# Patient Record
Sex: Male | Born: 1950 | Race: White | Hispanic: No | Marital: Single | Smoking: Current every day smoker
Health system: Southern US, Community
[De-identification: ages and names within clinical notes are randomized; demographics above are authoritative.]

## PROBLEM LIST (undated history)

## (undated) DIAGNOSIS — R319 Hematuria, unspecified: Secondary | ICD-10-CM

## (undated) DIAGNOSIS — M6530 Trigger finger, unspecified finger: Secondary | ICD-10-CM

## (undated) DIAGNOSIS — T4145XA Adverse effect of unspecified anesthetic, initial encounter: Secondary | ICD-10-CM

## (undated) DIAGNOSIS — T8859XA Other complications of anesthesia, initial encounter: Secondary | ICD-10-CM

## (undated) DIAGNOSIS — C801 Malignant (primary) neoplasm, unspecified: Secondary | ICD-10-CM

## (undated) HISTORY — DX: Malignant (primary) neoplasm, unspecified: C80.1

## (undated) HISTORY — PX: WISDOM TOOTH EXTRACTION: SHX21

---
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
## 1986-07-03 HISTORY — PX: OTHER SURGICAL HISTORY: SHX169

## 2017-07-03 DIAGNOSIS — M6530 Trigger finger, unspecified finger: Secondary | ICD-10-CM

## 2017-07-03 HISTORY — DX: Trigger finger, unspecified finger: M65.30

## 2018-02-01 NOTE — Progress Notes (Signed)
02/04/2018 3:44 PM   Jared Nolan 1950-09-10 [id number]  Referring provider: Mariana Arn, MD [address] [place], [place] [address]  Chief Complaint  Patient presents with  . Hematuria    HPI: Patient is a 67 -year-old Caucasian male who presents today as a referral from Dr. Mariana Arn for gross hematuria.    He has been having intermittent gross hematuria for two years associated with dysuria and a decreased urinary stream.    He does not have a prior history of recurrent urinary tract infections, nephrolithiasis, trauma to the genitourinary tract, BPH or malignancies of the genitourinary tract.   His paternal uncle had fatal prostate cancer and died at age 36.  His father had stones.      Today, he is having symptoms of frequent urination, urgency, nocturia, hesitancy, intermittency, straining to urinate or a weak urinary stream.  Patient denies any gross hematuria, dysuria or suprapubic/flank pain.  Patient denies any fevers, chills, nausea or vomiting.  His UA today demonstrates 6-10 WBC's and > 30 RBC's and many bacteria.  PVR is 132 mL.    He is a smoker.    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   He has HTN.      PMH: History reviewed. No pertinent past medical history.  Surgical History: History reviewed. No pertinent surgical history.  Home Medications:  Allergies as of 02/04/2018   No Known Allergies     Medication List    as of 02/04/2018  3:44 PM   You have not been prescribed any medications.     Allergies: No Known Allergies  Family History: Family History  Problem Relation Age of Onset  . Prostate cancer Paternal Uncle     Social History:  reports that he has been smoking cigarettes.  He has smoked for the past 20.00 years. He has never used smokeless tobacco. He reports that he drank alcohol. He reports that he does not use drugs.  ROS: UROLOGY Frequent Urination?: Yes Hard to postpone urination?:  Yes Burning/pain with urination?: No Get up at night to urinate?: Yes Leakage of urine?: No Urine stream starts and stops?: Yes Trouble starting stream?: Yes Do you have to strain to urinate?: Yes Blood in urine?: No Urinary tract infection?: No Sexually transmitted disease?: No Injury to kidneys or bladder?: No Painful intercourse?: No Weak stream?: Yes Erection problems?: No Penile pain?: No  Gastrointestinal Nausea?: No Vomiting?: No Indigestion/heartburn?: No Diarrhea?: No Constipation?: No  Constitutional Fever: No Night sweats?: No Weight loss?: No Fatigue?: No  Skin Skin rash/lesions?: No Itching?: No  Eyes Blurred vision?: No Double vision?: No  Ears/Nose/Throat Sore throat?: No Sinus problems?: No  Hematologic/Lymphatic Swollen glands?: No Easy bruising?: No  Cardiovascular Leg swelling?: No Chest pain?: No  Respiratory Cough?: No Shortness of breath?: No  Endocrine Excessive thirst?: No  Musculoskeletal Back pain?: No Joint pain?: No  Neurological Headaches?: No Dizziness?: No  Psychologic Depression?: No Anxiety?: No  Physical Exam: BP (!) 147/74 (BP Location: Left Arm, Patient Position: Sitting, Cuff Size: Normal)   Pulse 81   Ht 5\' 7"  (1.702 m)   Wt 167 lb 11.2 oz (76.1 kg)   BMI 26.27 kg/m   Constitutional:  Well nourished. Alert and oriented, No acute distress. HEENT: [place] AT, moist mucus membranes.  Trachea midline, no masses. Cardiovascular: No clubbing, cyanosis, or edema. Respiratory: Normal respiratory effort, no increased work of breathing. GI: Abdomen is soft, non tender, non distended, no abdominal  masses. Liver and spleen not palpable.  No hernias appreciated.  Stool sample for occult testing is not indicated.   GU: No CVA tenderness.  No bladder fullness or masses.  Patient with circumcised phallus.   Urethral meatus is patent.  No penile discharge. No penile lesions or rashes. Scrotum without lesions, cysts,  rashes and/or edema.  Testicles are located scrotally bilaterally. No masses are appreciated in the testicles. Left and right epididymis are normal. Rectal: Patient with  normal sphincter tone. Anus and perineum without scarring or rashes. No rectal masses are appreciated. Prostate is approximately 60 grams, could only palpate the apex and midportion, no nodules are appreciated. Seminal vesicles are normal. Skin: No rashes, bruises or suspicious lesions. Lymph: No cervical or inguinal adenopathy. Neurologic: Grossly intact, no focal deficits, moving all 4 extremities. Psychiatric: Normal mood and affect.  Laboratory Data: No results found for: WBC, HGB, HCT, MCV, PLT  No results found for: CREATININE  No results found for: PSA  No results found for: TESTOSTERONE  No results found for: HGBA1C  No results found for: TSH  No results found for: CHOL, HDL, CHOLHDL, VLDL, LDLCALC  No results found for: AST No results found for: ALT No components found for: ALKALINEPHOPHATASE No components found for: BILIRUBINTOTAL  No results found for: ESTRADIOL   Urinalysis See Epic and HPI  I have reviewed the labs.  Assessment & Plan:   1. Gross hematuria Explained to the patient that there are a number of causes that can be associated with blood in the urine, such as stones, BPH, UTI's, damage to the urinary tract and/or cancer. At this time, I felt that the patient warranted further urologic evaluation.   The AUA guidelines state that a CT urogram is the preferred imaging study to evaluate hematuria. I explained to the patient that a contrast material will be injected into a vein and that in rare instances, an allergic reaction can result and may even life threatening   The patient denies any allergies to contrast, iodine and/or seafood and is not taking metformin. Following the imaging study,  I've recommended a cystoscopy. I described how this is performed, typically in an office setting  with a flexible cystoscope. We described the risks, benefits, and possible side effects, the most common of which is a minor amount of blood in the urine and/or burning which usually resolves in 24 to 48 hours.   The patient had the opportunity to ask questions which were answered. Based upon this discussion, the patient is willing to proceed. Therefore, I've ordered: a CT Urogram and cystoscopy.  The patient will return following all of the above for discussion of the results.  UA - see HPI and Epic  Urine culture pending  BUN + creatinine pending   Return for CT Urogram report and cystoscopy.  These notes generated with voice recognition software. I apologize for typographical errors.  Zara Council, PA-C  [facility] [address] [place]  [place], [place] [address] [phone]

## 2018-02-04 ENCOUNTER — Encounter: Payer: Self-pay | Admitting: Urology

## 2018-02-04 ENCOUNTER — Ambulatory Visit (INDEPENDENT_AMBULATORY_CARE_PROVIDER_SITE_OTHER): Payer: Medicare Other | Admitting: Urology

## 2018-02-04 VITALS — BP 147/74 | HR 81 | Ht 67.0 in | Wt 167.7 lb

## 2018-02-04 DIAGNOSIS — R310 Gross hematuria: Secondary | ICD-10-CM | POA: Diagnosis not present

## 2018-02-04 DIAGNOSIS — Z720 Tobacco use: Secondary | ICD-10-CM | POA: Insufficient documentation

## 2018-02-04 LAB — URINALYSIS, COMPLETE
Bilirubin, UA: NEGATIVE
Glucose, UA: NEGATIVE
KETONES UA: NEGATIVE
Nitrite, UA: NEGATIVE
SPEC GRAV UA: 1.02 (ref 1.005–1.030)
Urobilinogen, Ur: 0.2 mg/dL (ref 0.2–1.0)
pH, UA: 6 (ref 5.0–7.5)

## 2018-02-04 LAB — MICROSCOPIC EXAMINATION

## 2018-02-04 LAB — BLADDER SCAN AMB NON-IMAGING

## 2018-02-08 LAB — CULTURE, URINE COMPREHENSIVE

## 2018-03-08 ENCOUNTER — Ambulatory Visit
Admission: RE | Admit: 2018-03-08 | Discharge: 2018-03-08 | Disposition: A | Discharge status: Home / Self Care | Payer: Medicare Other | Source: Ambulatory Visit | Attending: Urology | Admitting: Urology

## 2018-03-08 ENCOUNTER — Telehealth: Payer: Self-pay

## 2018-03-08 DIAGNOSIS — M48061 Spinal stenosis, lumbar region without neurogenic claudication: Secondary | ICD-10-CM | POA: Insufficient documentation

## 2018-03-08 DIAGNOSIS — R310 Gross hematuria: Secondary | ICD-10-CM | POA: Insufficient documentation

## 2018-03-08 DIAGNOSIS — N329 Bladder disorder, unspecified: Secondary | ICD-10-CM | POA: Diagnosis not present

## 2018-03-08 LAB — POCT I-STAT CREATININE: CREATININE: 0.9 mg/dL (ref 0.61–1.24)

## 2018-03-08 MED ORDER — IOPAMIDOL (ISOVUE-300) INJECTION 61%
125.0000 mL | Freq: Once | INTRAVENOUS | Status: AC | PRN
  Administered 2018-03-08: 125 mL via INTRAVENOUS

## 2018-03-08 NOTE — Telephone Encounter (Signed)
Incoming call from [facility] radiology alerting you to the imaging report on this patient please review

## 2018-03-13 ENCOUNTER — Encounter: Payer: Self-pay | Admitting: Urology

## 2018-03-13 ENCOUNTER — Ambulatory Visit (INDEPENDENT_AMBULATORY_CARE_PROVIDER_SITE_OTHER): Payer: Medicare Other | Admitting: Urology

## 2018-03-13 VITALS — BP 120/67 | HR 83 | Ht 67.0 in | Wt 168.5 lb

## 2018-03-13 DIAGNOSIS — R310 Gross hematuria: Secondary | ICD-10-CM | POA: Diagnosis not present

## 2018-03-13 LAB — URINALYSIS, COMPLETE
BILIRUBIN UA: NEGATIVE
GLUCOSE, UA: NEGATIVE
Nitrite, UA: NEGATIVE
PH UA: 6.5 (ref 5.0–7.5)
SPEC GRAV UA: 1.02 (ref 1.005–1.030)
Urobilinogen, Ur: 1 mg/dL (ref 0.2–1.0)

## 2018-03-13 LAB — MICROSCOPIC EXAMINATION
Epithelial Cells (non renal): NONE SEEN /hpf (ref 0–10)
RBC, UA: >30 /hpf — ABNORMAL HIGH (ref 0–2)

## 2018-03-13 MED ORDER — LIDOCAINE HCL URETHRAL/MUCOSAL 2 % EX GEL: 1.0000 "application " | Freq: Once | CUTANEOUS | Status: DC

## 2018-03-13 NOTE — Progress Notes (Signed)
   03/13/18  CC:  Chief Complaint  Patient presents with  . Cysto   Indications: Gross hematuria  HPI: 67 year old male seen by Zara Council on 02/04/2018 for intermittent gross hematuria x2 years.  CT urogram performed on 03/08/2018 showed a greater than 6 cm intravesical mass encompassing most of the right side of the bladder.  No hydronephrosis was present.  Blood pressure 120/67, pulse 83, height 5\' 7"  (1.702 m), weight 168 lb 8 oz (76.4 kg). NED. A&Ox3.   No respiratory distress   Abd soft, NT, ND Normal phallus with bilateral descended testicles  Cystoscopy Procedure Note  Patient identification was confirmed, informed consent was obtained, and patient was prepped using Betadine solution.  Lidocaine jelly was administered per urethral meatus.    Preoperative abx where received prior to procedure.     Pre-Procedure: - Inspection reveals a normal caliber ureteral meatus.  Procedure: The flexible cystoscope was introduced without difficulty - No urethral strictures/lesions are present. - Nonocclusive prostate - Normal bladder neck - Bilateral ureteral orifices not identified - Bladder mucosa  reveals a large mixed papillary/solid mass encompassing the right side of the bladder.  The ureteral views were not visualized due to suboptimal visualization.  Tumor was encroaching the bladder neck but did not involve the prostatic urethra grossly.   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  The findings were discussed in detail with Mr. Fabio Neighbors and I recommended scheduling transurethral section of bladder tumor.  The potential need for a right ureteral stent was also discussed.  Potential risks were discussed including bleeding, infection/sepsis and bladder perforation.  The need for a Foley catheter postoperatively was  also discussed.  He indicated all questions were answered and desires to schedule.

## 2018-03-15 ENCOUNTER — Other Ambulatory Visit: Payer: Self-pay | Admitting: Radiology

## 2018-03-15 DIAGNOSIS — D494 Neoplasm of unspecified behavior of bladder: Secondary | ICD-10-CM

## 2018-03-26 ENCOUNTER — Encounter
Admission: RE | Admit: 2018-03-26 | Discharge: 2018-03-26 | Disposition: A | Discharge status: Home / Self Care | Payer: Medicare Other | Source: Ambulatory Visit | Attending: Urology | Admitting: Urology

## 2018-03-26 ENCOUNTER — Other Ambulatory Visit: Payer: Self-pay

## 2018-03-26 DIAGNOSIS — Z01818 Encounter for other preprocedural examination: Secondary | ICD-10-CM | POA: Insufficient documentation

## 2018-03-26 HISTORY — DX: Other complications of anesthesia, initial encounter: T88.59XA

## 2018-03-26 HISTORY — DX: Hematuria, unspecified: R31.9

## 2018-03-26 HISTORY — DX: Adverse effect of unspecified anesthetic, initial encounter: T41.45XA

## 2018-03-26 HISTORY — DX: Trigger finger, unspecified finger: M65.30

## 2018-03-26 NOTE — Patient Instructions (Addendum)
Y                      Your procedure is scheduled on: Tuesday, April 02, 2018  Report to [facility]   DO NOT STOP ON THE FIRST FLOOR TO REGISTER  To find out your arrival time please call [phone] between 1PM - 3PM on Monday, April 01, 2018   Remember: Instructions that are not followed completely may result in serious medical risk,  up to and including death, or upon the discretion of your surgeon and anesthesiologist your  surgery may need to be rescheduled.     _X__ 1. Do not eat food after midnight the night before your procedure.                 No gum chewing or hard candies.                    ABSOLUTELY, NOTHING SOLID IN YOUR MOUTH AFTER  MIDNIGHT                  You may drink clear liquids up to 2 hours before you are scheduled to arrive for your surgery-                  DO not drink clear liquids within 2 hours of the start of your surgery.                  Clear Liquids include:  water, apple juice without pulp, clear carbohydrate                 drink such as Clearfast of Gatorade, Black Coffee or Tea (Do not add                 anything to coffee or tea).  __X__2.  On the morning of surgery brush your teeth with toothpaste and water, you                may rinse your mouth with mouthwash if you wish.  Do not swallow any toothpaste of mouthwash.     _X__ 3.  No Alcohol for 24 hours before or after surgery.   _X__ 4.  Do Not Smoke or use e-cigarettes For 24 Hours Prior to Your Surgery.                 Do not use any chewable tobacco products for at least 6 hours prior to                 surgery.  ____  5.  Bring all medications with you on the day of surgery if instructed.   ____  6.  Notify your doctor if there is any change in your medical condition      (cold, fever, infections).     Do not wear jewelry, make-up, hairpins, clips or nail polish. Do not wear lotions, powders, or perfumes. You may wear deodorant. Do not shave  48 hours prior to surgery. Men may shave face and neck. Do not bring valuables to the hospital.    [facility] is not responsible for any belongings or valuables.  Contacts, dentures or bridgework may not be worn into surgery. Leave your suitcase in the car. After surgery it may be brought to your room. For patients admitted to the hospital, discharge time is determined by your treatment team.   Patients discharged the day of  surgery will not be allowed to drive home.   Please read over the following fact sheets that you were given:  Preparing for surgery:   ____ Take these medicines the morning of surgery with A SIP OF WATER:    1. None  2.   3.   4.  5.  6.  ____ Fleet Enema (as directed)   ____ Use CHG Soap as directed  ____ Use inhalers on the day of surgery  __X__ Stop ALL ASPIRIN PRODUCTS AS OF TODAY \              THIS INCLUDES EXCEDRIN / BC POWDER / GOODIES POWDER  __X__ Stop Anti-inflammatories AS OF TODAY                THIS INCLUDES IBUPROFEN / MOTRIN / ADVIL / ALEVE / NAPROXYN / BC POWDER / GOODIES POWDER  ____ Stop supplements until after surgery.    ____ Bring C-Pap to the hospital.   [place].

## 2018-03-27 LAB — URINE CULTURE

## 2018-04-02 ENCOUNTER — Ambulatory Visit: Payer: Medicare Other | Admitting: Anesthesiology

## 2018-04-02 ENCOUNTER — Encounter: Payer: Self-pay | Admitting: *Deleted

## 2018-04-02 ENCOUNTER — Encounter
Admission: RE | Disposition: A | Discharge status: Home / Self Care | Payer: Self-pay | Source: Ambulatory Visit | Attending: Urology

## 2018-04-02 ENCOUNTER — Observation Stay
Admission: RE | Admit: 2018-04-02 | Discharge: 2018-04-03 | Disposition: A | Discharge status: Home / Self Care | Payer: Medicare Other | Source: Ambulatory Visit | Attending: Urology | Admitting: Urology

## 2018-04-02 ENCOUNTER — Other Ambulatory Visit: Payer: Self-pay

## 2018-04-02 DIAGNOSIS — Z8042 Family history of malignant neoplasm of prostate: Secondary | ICD-10-CM | POA: Insufficient documentation

## 2018-04-02 DIAGNOSIS — R310 Gross hematuria: Secondary | ICD-10-CM | POA: Diagnosis present

## 2018-04-02 DIAGNOSIS — F17210 Nicotine dependence, cigarettes, uncomplicated: Secondary | ICD-10-CM | POA: Diagnosis not present

## 2018-04-02 DIAGNOSIS — C672 Malignant neoplasm of lateral wall of bladder: Principal | ICD-10-CM | POA: Insufficient documentation

## 2018-04-02 DIAGNOSIS — D494 Neoplasm of unspecified behavior of bladder: Secondary | ICD-10-CM | POA: Diagnosis present

## 2018-04-02 HISTORY — PX: TRANSURETHRAL RESECTION OF BLADDER TUMOR: SHX2575

## 2018-04-02 SURGERY — TURBT (TRANSURETHRAL RESECTION OF BLADDER TUMOR)
Anesthesia: General

## 2018-04-02 MED ORDER — PROPOFOL 10 MG/ML IV BOLUS
INTRAVENOUS | Status: DC | PRN
  Administered 2018-04-02: 150 mg via INTRAVENOUS

## 2018-04-02 MED ORDER — ROCURONIUM BROMIDE 100 MG/10ML IV SOLN
INTRAVENOUS | Status: DC | PRN
  Administered 2018-04-02: 10 mg via INTRAVENOUS
  Administered 2018-04-02: 50 mg via INTRAVENOUS
  Administered 2018-04-02: 20 mg via INTRAVENOUS

## 2018-04-02 MED ORDER — OXYCODONE HCL 5 MG PO TABS: 5.0000 mg | ORAL_TABLET | Freq: Once | ORAL | Status: DC | PRN

## 2018-04-02 MED ORDER — FENTANYL CITRATE (PF) 100 MCG/2ML IJ SOLN
INTRAMUSCULAR | Status: AC
  Filled 2018-04-02: qty 2

## 2018-04-02 MED ORDER — OXYCODONE HCL 5 MG/5ML PO SOLN: 5.0000 mg | Freq: Once | ORAL | Status: DC | PRN

## 2018-04-02 MED ORDER — MIDAZOLAM HCL 2 MG/2ML IJ SOLN
INTRAMUSCULAR | Status: AC
  Filled 2018-04-02: qty 2

## 2018-04-02 MED ORDER — CEFAZOLIN SODIUM-DEXTROSE 1-4 GM/50ML-% IV SOLN
1.0000 g | Freq: Three times a day (TID) | INTRAVENOUS | Status: AC
  Administered 2018-04-02 – 2018-04-03 (×2): 1 g via INTRAVENOUS
  Filled 2018-04-02 (×3): qty 50

## 2018-04-02 MED ORDER — SUGAMMADEX SODIUM 200 MG/2ML IV SOLN
INTRAVENOUS | Status: DC | PRN
  Administered 2018-04-02: 20 mg via INTRAVENOUS

## 2018-04-02 MED ORDER — FAMOTIDINE 20 MG PO TABS
ORAL_TABLET | ORAL | Status: AC
  Administered 2018-04-02: 20 mg via ORAL
  Filled 2018-04-02: qty 1

## 2018-04-02 MED ORDER — FENTANYL CITRATE (PF) 100 MCG/2ML IJ SOLN
INTRAMUSCULAR | Status: DC | PRN
  Administered 2018-04-02 (×6): 50 ug via INTRAVENOUS

## 2018-04-02 MED ORDER — PHENYLEPHRINE HCL 10 MG/ML IJ SOLN
INTRAMUSCULAR | Status: DC | PRN
  Administered 2018-04-02: 100 ug via INTRAVENOUS

## 2018-04-02 MED ORDER — ONDANSETRON HCL 4 MG/2ML IJ SOLN
INTRAMUSCULAR | Status: DC | PRN
  Administered 2018-04-02: 4 mg via INTRAVENOUS

## 2018-04-02 MED ORDER — LACTATED RINGERS IV SOLN
INTRAVENOUS | Status: DC
  Administered 2018-04-02: 09:00:00 via INTRAVENOUS

## 2018-04-02 MED ORDER — BELLADONNA ALKALOIDS-OPIUM 16.2-60 MG RE SUPP: 1.0000 | Freq: Four times a day (QID) | RECTAL | Status: DC | PRN

## 2018-04-02 MED ORDER — SODIUM CHLORIDE 0.9 % IR SOLN
3000.0000 mL | Status: DC
  Administered 2018-04-02: 3000 mL

## 2018-04-02 MED ORDER — SODIUM CHLORIDE 0.9 % IV SOLN
INTRAVENOUS | Status: DC
  Administered 2018-04-02 – 2018-04-03 (×2): via INTRAVENOUS

## 2018-04-02 MED ORDER — HYDROCODONE-ACETAMINOPHEN 5-325 MG PO TABS: 1.0000 | ORAL_TABLET | ORAL | Status: DC | PRN

## 2018-04-02 MED ORDER — ONDANSETRON HCL 4 MG/2ML IJ SOLN: 4.0000 mg | INTRAMUSCULAR | Status: DC | PRN

## 2018-04-02 MED ORDER — MUSCLE RUB 10-15 % EX CREA
1.0000 "application " | TOPICAL_CREAM | Freq: Four times a day (QID) | CUTANEOUS | Status: DC | PRN
  Filled 2018-04-02: qty 85

## 2018-04-02 MED ORDER — CEFAZOLIN SODIUM-DEXTROSE 2-4 GM/100ML-% IV SOLN
INTRAVENOUS | Status: AC
  Filled 2018-04-02: qty 100

## 2018-04-02 MED ORDER — FAMOTIDINE 20 MG PO TABS
20.0000 mg | ORAL_TABLET | Freq: Once | ORAL | Status: AC
  Administered 2018-04-02: 20 mg via ORAL

## 2018-04-02 MED ORDER — MIDAZOLAM HCL 2 MG/2ML IJ SOLN
INTRAMUSCULAR | Status: DC | PRN
  Administered 2018-04-02: 2 mg via INTRAVENOUS

## 2018-04-02 MED ORDER — FENTANYL CITRATE (PF) 100 MCG/2ML IJ SOLN
25.0000 ug | INTRAMUSCULAR | Status: DC | PRN
  Administered 2018-04-02: 50 ug via INTRAVENOUS

## 2018-04-02 MED ORDER — PROPOFOL 10 MG/ML IV BOLUS
INTRAVENOUS | Status: AC
  Filled 2018-04-02: qty 20

## 2018-04-02 MED ORDER — CEFAZOLIN SODIUM-DEXTROSE 2-4 GM/100ML-% IV SOLN
2.0000 g | INTRAVENOUS | Status: AC
  Administered 2018-04-02: 2 g via INTRAVENOUS

## 2018-04-02 MED ORDER — SEVOFLURANE IN SOLN
RESPIRATORY_TRACT | Status: AC
  Filled 2018-04-02: qty 250

## 2018-04-02 SURGICAL SUPPLY — 35 items
BAG DRAIN CYSTO-URO LG1000N (MISCELLANEOUS) ×4 IMPLANT
BAG URINE DRAINAGE (UROLOGICAL SUPPLIES) ×4 IMPLANT
BRUSH SCRUB EZ  4% CHG (MISCELLANEOUS) ×2
BRUSH SCRUB EZ 4% CHG (MISCELLANEOUS) ×1 IMPLANT
CATH FOLEY 2WAY  5CC 16FR (CATHETERS)
CATH URETL 5X70 OPEN END (CATHETERS) ×4 IMPLANT
CATH URTH 16FR FL 2W BLN LF (CATHETERS) IMPLANT
CONRAY 43 FOR UROLOGY 50M (MISCELLANEOUS) ×4 IMPLANT
DRAPE UTILITY 15X26 TOWEL STRL (DRAPES) ×4 IMPLANT
DRSG TELFA 4X3 1S NADH ST (GAUZE/BANDAGES/DRESSINGS) ×4 IMPLANT
ELECT BIVAP BIPO 22/24 DONUT (ELECTROSURGICAL) ×4
ELECT LOOP 22F BIPOLAR SML (ELECTROSURGICAL)
ELECT REM PT RETURN 9FT ADLT (ELECTROSURGICAL)
ELECTRD BIVAP BIPO 22/24 DONUT (ELECTROSURGICAL) ×1 IMPLANT
ELECTRODE LOOP 22F BIPOLAR SML (ELECTROSURGICAL) ×1 IMPLANT
ELECTRODE REM PT RTRN 9FT ADLT (ELECTROSURGICAL) IMPLANT
GLOVE BIO SURGEON STRL SZ8 (GLOVE) ×4 IMPLANT
GOWN STANDARD XL  REUSABL (MISCELLANEOUS) ×4 IMPLANT
GOWN STRL REUS W/ TWL LRG LVL3 (GOWN DISPOSABLE) ×2 IMPLANT
GOWN STRL REUS W/TWL LRG LVL3 (GOWN DISPOSABLE) ×2
GOWN STRL REUS W/TWL XL LVL4 (GOWN DISPOSABLE) ×4 IMPLANT
KIT TURNOVER CYSTO (KITS) ×4 IMPLANT
LOOP CUT BIPOLAR 24F LRG (ELECTROSURGICAL) ×3 IMPLANT
PACK CYSTO AR (MISCELLANEOUS) ×4 IMPLANT
SENSORWIRE 0.038 NOT ANGLED (WIRE) ×4
SET CYSTO W/LG BORE CLAMP LF (SET/KITS/TRAYS/PACK) ×4 IMPLANT
SET IRRIG Y TYPE TUR BLADDER L (SET/KITS/TRAYS/PACK) ×4 IMPLANT
SOL .9 NS 3000ML IRR  AL (IV SOLUTION) ×30
SOL .9 NS 3000ML IRR UROMATIC (IV SOLUTION) ×16 IMPLANT
STENT URET 6FRX24 CONTOUR (STENTS) IMPLANT
STENT URET 6FRX26 CONTOUR (STENTS) IMPLANT
SURGILUBE 2OZ TUBE FLIPTOP (MISCELLANEOUS) ×4 IMPLANT
SYRINGE IRR TOOMEY STRL 70CC (SYRINGE) ×4 IMPLANT
WATER STERILE IRR 1000ML POUR (IV SOLUTION) ×4 IMPLANT
WIRE SENSOR 0.038 NOT ANGLED (WIRE) ×2 IMPLANT
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
## 2018-04-02 NOTE — Progress Notes (Signed)
Postop note No complaints Vital signs stable, afebrile  Exam: Abdomen soft; CBI effluent pink-tinged on low-flow  Impression: Doing well status post TURBT Plan: Discontinue CBI in a.m. if effluent remains clear to pink

## 2018-04-02 NOTE — Op Note (Signed)
Preoperative diagnosis: 1. Bladder tumor (>6 cm)  Postoperative diagnosis:  1. Bladder tumor (>6 cm)  Procedure:  1. Cystoscopy 2. Transurethral resection of bladder tumor (large)  Surgeon: Nicki Reaper C. Aayansh Codispoti, M.D.  Anesthesia: General  Complications: None  Intraoperative findings:  1. Bladder tumor: Extremely large nodular/papillary tumor involving the entire right lateral wall, circumferential bladder neck, trigone and part of the left lateral wall.  Tumor at bladder neck but prostatic urethral mucosa normal in appearance.  The ureteral orifices could not be identified  EBL: Minimal  Specimens: 1. Bladder tumor 2. Base of bladder tumor      Indication: Jared Nolan is a patient who was found to have a >6 cm bladder tumor on CT after a one 2-year history of intermittent gross hematuria. After reviewing the management options for treatment, he elected to proceed with the above surgical procedure(s). We have discussed the potential benefits and risks of the procedure, side effects of the proposed treatment, the likelihood of the patient achieving the goals of the procedure, and any potential problems that might occur during the procedure or recuperation. Informed consent has been obtained.  Description of procedure:  The patient was taken to the operating room and general anesthesia was induced.  The patient was placed in the dorsal lithotomy position, prepped and draped in the usual sterile fashion, and preoperative antibiotics were administered. A preoperative time-out was performed.   Cystourethroscopy was performed.  The patient's urethra was examined and was normal/ demonstrated mild to moderate lateral lobe enlargement and mild bladder neck elevation.  The bladder was then systematically examined in its entirety.  With findings as described above  A 26 French continuous-flow resectoscope sheath with visual obturator was lubricated and passed per urethra.    The bladder  was then re-examined after the resectoscope was placed.  Starting on the right lateral wall superiorly and working posterior to anterior the tumor was resected down to muscle.  The bladder neck tumor was circumferentially resected followed by the left lateral wall tumor.  All specimen was removed by irrigation.  Additional resection of the right lateral wall muscle was obtained and sent separately.  After approximately 2 hours of resection time it was estimated 75% of the tumor had been resected.  Due to venous oozing and suboptimal visualization it was elected to and the procedure.  No brisk arterial bleeding was noted.  The ureteral orifice ease were never identified.  There was no hydronephrosis on CT.   The bladder was then emptied and the procedure ended.  The patient appeared to tolerate the procedure well and without complications.  Due to the amount of venous oozing it was elected to place a 22 [place] three-way Foley catheter and the CBI effluent was medium to light rose'.  After anesthetic reversal he was transported to the PACU in stable condition.   Deysi Soldo C. Bernardo Heater,  MD

## 2018-04-02 NOTE — Anesthesia Post-op Follow-up Note (Signed)
Anesthesia QCDR form completed.        

## 2018-04-02 NOTE — Progress Notes (Signed)
IS education complete, pt understands technique and reason for use. Pt inspire 2514ml+. Pt independent with use.

## 2018-04-02 NOTE — Anesthesia Procedure Notes (Signed)
Procedure Name: Intubation Date/Time: 04/02/2018 10:05 AM Performed by: Philbert Riser, CRNA Pre-anesthesia Checklist: Patient identified, Emergency Drugs available, Suction available, Patient being monitored and Timeout performed Patient Re-evaluated:Patient Re-evaluated prior to induction Oxygen Delivery Method: Circle system utilized and Simple face mask Preoxygenation: Pre-oxygenation with 100% oxygen Induction Type: IV induction Ventilation: Mask ventilation without difficulty Laryngoscope Size: McGraph and 3 Grade View: Grade II Tube type: Oral Tube size: 7.0 mm Number of attempts: 1 Airway Equipment and Method: Stylet Placement Confirmation: ETT inserted through vocal cords under direct vision,  positive ETCO2 and breath sounds checked- equal and bilateral Secured at: 22 cm Tube secured with: Tape Dental Injury: Teeth and Oropharynx as per pre-operative assessment
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
## 2018-04-02 NOTE — Interval H&P Note (Signed)
History and Physical Interval Note:  04/02/2018 9:46 AM  Jared Nolan  has presented today for surgery, with the diagnosis of bladder tumor  The various methods of treatment have been discussed with the patient and family. After consideration of risks, benefits and other options for treatment, the patient has consented to  Procedure(s): TRANSURETHRAL RESECTION OF BLADDER TUMOR (TURBT) (N/A) CYSTOSCOPY WITH STENT PLACEMENT (Right) as a surgical intervention .  The patient's history has been reviewed, patient examined, no change in status, stable for surgery.  I have reviewed the patient's chart and labs.  Questions were answered to the patient's satisfaction.     [place]

## 2018-04-02 NOTE — Plan of Care (Signed)

## 2018-04-02 NOTE — Transfer of Care (Signed)
Immediate Anesthesia Transfer of Care Note  Patient: Jared Nolan  Procedure(s) Performed: TRANSURETHRAL RESECTION OF BLADDER TUMOR (TURBT) (N/A ) CYSTOSCOPY WITH STENT PLACEMENT (Right )  Patient Location: PACU  Anesthesia Type:General  Level of Consciousness: awake, alert  and oriented  Airway & Oxygen Therapy: Patient Spontanous Breathing and Patient connected to face mask oxygen  Post-op Assessment: Report given to RN and Post -op Vital signs reviewed and stable  Post vital signs: Reviewed and stable  Last Vitals:  Vitals Value Taken Time  BP    Temp    Pulse    Resp    SpO2      Last Pain:  Vitals:   04/02/18 0844  TempSrc: Temporal  PainSc: 0-No pain         Complications: No apparent anesthesia complications

## 2018-04-03 ENCOUNTER — Other Ambulatory Visit: Payer: Self-pay | Admitting: Urology

## 2018-04-03 ENCOUNTER — Encounter: Payer: Self-pay | Admitting: Urology

## 2018-04-03 DIAGNOSIS — D494 Neoplasm of unspecified behavior of bladder: Secondary | ICD-10-CM | POA: Diagnosis not present

## 2018-04-03 DIAGNOSIS — C672 Malignant neoplasm of lateral wall of bladder: Secondary | ICD-10-CM | POA: Diagnosis not present

## 2018-04-03 LAB — BASIC METABOLIC PANEL
Anion gap: 3 — ABNORMAL LOW (ref 5–15)
BUN: 12 mg/dL (ref 8–23)
CHLORIDE: 107 mmol/L (ref 98–111)
CO2: 26 mmol/L (ref 22–32)
Calcium: 7.6 mg/dL — ABNORMAL LOW (ref 8.9–10.3)
Creatinine, Ser: 0.81 mg/dL (ref 0.61–1.24)
GFR calc Af Amer: >60 mL/min (ref >60)
GFR calc non Af Amer: >60 mL/min (ref >60)
Glucose, Bld: 106 mg/dL — ABNORMAL HIGH (ref 70–99)
POTASSIUM: 4.3 mmol/L (ref 3.5–5.1)
SODIUM: 136 mmol/L (ref 135–145)

## 2018-04-03 LAB — CBC
HCT: 22.4 % — ABNORMAL LOW (ref 40.0–52.0)
HEMOGLOBIN: 7.6 g/dL — AB (ref 13.0–18.0)
MCH: 28.5 pg (ref 26.0–34.0)
MCHC: 34 g/dL (ref 32.0–36.0)
MCV: 83.9 fL (ref 80.0–100.0)
Platelets: 172 10*3/uL (ref 150–440)
RBC: 2.67 MIL/uL — ABNORMAL LOW (ref 4.40–5.90)
RDW: 16.9 % — ABNORMAL HIGH (ref 11.5–14.5)
WBC: 9 10*3/uL (ref 3.8–10.6)

## 2018-04-03 LAB — SURGICAL PATHOLOGY

## 2018-04-03 MED ORDER — FERROUS SULFATE 325 (65 FE) MG PO TBEC: 325.0000 mg | DELAYED_RELEASE_TABLET | Freq: Three times a day (TID) | ORAL | 3 refills | Status: DC

## 2018-04-03 NOTE — Discharge Instructions (Signed)
Transurethral Resection of Bladder Tumor Transurethral resection of a bladder tumor is the removal (resection) of a cancerous growth (tumor) on the inside wall of the bladder. The bladder is the organ that holds urine. The tumor is removed through the tube that carries urine out of the body (urethra). In a transurethral resection, a thin telescope with a light, a tiny camera, and an electric cutting edge (resectoscope) is passed through the urethra. In men, the opening of the urethra is at the end of the penis. In women, it is just above the vaginal opening. Tell a health care provider about:  Any allergies you have.  All medicines you are taking, including vitamins, herbs, eye drops, creams, and over-the-counter medicines.  Any problems you or family members have had with anesthetic medicines.  Any blood disorders you have.  Any surgeries you have had.  Any medical conditions you have.  Any recent urinary tract infections you have had.  Whether you are pregnant or may be pregnant. What are the risks? Generally, this is a safe procedure. However, problems may occur, including:  Infection.  Bleeding.  Allergic reactions to medicines.  Damage to other structures or organs, such as: ? The urethra. ? The tubes that drain urine from the kidneys into the bladder (ureters).  Pain and burning during urination.  Difficulty urinating due to partial blockage of the urethra.  Inability to urinate (urinary retention).  What happens before the procedure?  Follow instructions from your health care provider about eating and drinking restrictions.  Ask your health care provider about: ? Changing or stopping your regular medicines. This is especially important if you are taking diabetes medicines or blood thinners. ? Taking medicines such as aspirin and ibuprofen. These medicines can thin your blood. Do not take these medicines before your procedure if your health care provider instructs  you not to.  You may have a physical exam.  You may have tests, including: ? Blood tests. ? Urine tests. ? Electrocardiogram (ECG). This test measures the electrical activity of the heart.  You may be given antibiotic medicine to help prevent infection.  Ask your health care provider how your surgical site will be marked or identified.  Plan to have someone take you home after the procedure. What happens during the procedure?  To reduce your risk of infection: ? Your health care team will wash or sanitize their hands. ? Your skin will be washed with soap.  An IV tube will be inserted into one of your veins.  You will be given one or more of the following: ? A medicine to help you relax (sedative). ? A medicine to make you fall asleep (general anesthetic). ? A medicine that is injected into your spine to numb the area below and slightly above the injection site (spinal anesthetic).  Your legs will be placed in foot rests (stirrups) so that your legs are apart and your knees are bent.  The resectoscope will be passed through your urethra and into your bladder.  The part of your bladder that is affected by the tumor will be resected using the cutting edge of the resectoscope.  The resectoscope will be removed.  A thin, flexible tube (catheter) will be passed through your urethra and into your bladder. The catheter will drain urine into a bag outside of your body. ? Fluid may be passed through the catheter to keep the catheter open. The procedure may vary among health care providers and hospitals. What happens after the procedure?  Your blood pressure, heart rate, breathing rate, and blood oxygen level will be monitored often until the medicines you were given have worn off.  You may continue to receive fluids and medicines through an IV tube.  You will have some pain. Pain medicine will be available to help you.  You will have a catheter draining your urine. ? You will  have blood in your urine. Your catheter may be kept in until your urine is clear. ? Your urinary drainage will be monitored. If necessary, your bladder may be rinsed out (irrigated) by passing fluid through your catheter.  You will be encouraged to walk around as soon as possible.  You may have to wear compression stockings. These stockings help to prevent blood clots and reduce swelling in your legs.  Do not drive for 24 hours if you received a sedative. This information is not intended to replace advice given to you by your health care provider. Make sure you discuss any questions you have with your health care provider. Document Released: 04/15/2009 Document Revised: 02/20/2016 Document Reviewed: 03/11/2015 Elsevier Interactive Patient Education  2018 [employer].

## 2018-04-03 NOTE — Care Management Obs Status (Signed)
[place] NOTIFICATION   Patient Details  Name: Jared Nolan MRN: [id number] Date of Birth: 03-29-1951   Medicare Observation Status Notification Given:  Yes    Beverly Sessions, RN 04/03/2018, 1:59 PM

## 2018-04-03 NOTE — Progress Notes (Signed)
No complaints.  Ambulating without difficulty.  No lightheadedness or orthostatic symptoms. Urine rose off CBI  Plan: DC home with indwelling Foley.  Follow-up [facility] 10/4 for Foley catheter removal.

## 2018-04-03 NOTE — Progress Notes (Signed)
POD #1  No complaints this morning. VSS, afebrile  Exam: Abdomen soft, CBI effluent pink-tinged on low-flow  Hematocrit 22.7  Impression: Stable status post incomplete TURBT for large bladder tumor.  His hematocrit was not checked preoperatively.  He has had chronic hematuria and I suspect it was low as he is asymptomatic.  Recommendation: 1.  DC CBI 2.  Ambulate and if no orthostatic changes will DC with indwelling Foley catheter and start on iron.

## 2018-04-03 NOTE — Anesthesia Postprocedure Evaluation (Signed)
Anesthesia Post Note  Patient: Jared Nolan  Procedure(s) Performed: TRANSURETHRAL RESECTION OF BLADDER TUMOR (TURBT) (N/A )  Patient location during evaluation: PACU Anesthesia Type: General Level of consciousness: awake and alert Pain management: pain level controlled Vital Signs Assessment: post-procedure vital signs reviewed and stable Respiratory status: spontaneous breathing, nonlabored ventilation, respiratory function stable and patient connected to nasal cannula oxygen Cardiovascular status: blood pressure returned to baseline and stable Postop Assessment: no apparent nausea or vomiting Anesthetic complications: no     Last Vitals:  Vitals:   04/03/18 0518 04/03/18 1200  BP: 117/68 107/62  Pulse: 77 95  Resp: 17 17  Temp: 36.8 C 37.2 C  SpO2: 96% 96%    Last Pain:  Vitals:   04/03/18 1600  TempSrc:   PainSc: 0-No pain                 Precious Haws Prestyn Stanco

## 2018-04-04 ENCOUNTER — Ambulatory Visit (INDEPENDENT_AMBULATORY_CARE_PROVIDER_SITE_OTHER): Payer: Medicare Other | Admitting: Family Medicine

## 2018-04-04 DIAGNOSIS — D494 Neoplasm of unspecified behavior of bladder: Secondary | ICD-10-CM

## 2018-04-04 NOTE — Progress Notes (Signed)
Bladder Irrigation  Due to hematuria patient is present today for a bladder irrigation. Patient was cleaned and prepped in a sterile fashion. 250 ml of saline/sterile water was instilled and irrigated into the bladder with a 74ml Toomey syringe through the catheter in place.  After irrigation urine flow was noted no complications were noted catheter is now draining fine.Patient tolerated well.   Preformed by: Elberta Leatherwood, CMA  I consulted with Dr. Bernardo Heater about the irrigation and per his request I removed the catheter.   Catheter Removal  Patient is present today for a catheter removal.  80ml of water was drained from the balloon. A 22FR foley cath was removed from the bladder no complications were noted . Patient tolerated well.  Preformed by: Elberta Leatherwood, CMA  Follow up/ Additional notes: As scheduled

## 2018-04-05 ENCOUNTER — Ambulatory Visit: Payer: Medicare Other

## 2018-04-05 ENCOUNTER — Telehealth: Payer: Self-pay | Admitting: Urology

## 2018-04-05 NOTE — Telephone Encounter (Signed)
Pathology report was discussed in detail with Mr. Jared Nolan.  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  His tumor was incompletely resected and will need to schedule follow-up TURBT.   Amy please call regarding scheduling repeat TURBT within the next few weeks.  I put the order sheet on your desk.  Thanks

## 2018-04-05 NOTE — Discharge Summary (Signed)
Date of admission: 04/02/2018  Date of discharge: 04/05/2018  Admission diagnosis: Bladder tumor  Discharge diagnosis: Bladder tumor  Secondary diagnoses:  Patient Active Problem List   Diagnosis Date Noted  . Bladder tumor 04/02/2018  . Tobacco use 02/04/2018    History and Physical: For full details, please see admission history and physical. Briefly, Jared Nolan is a 67 y.o. year old patient with >6 cm bladder tumor admitted for TURBT.  He was kept overnight on continuous bladder irrigation.  Hospital Course: Patient tolerated the procedure well.  He was then transferred to the floor after an uneventful PACU stay.  His hospital course was uncomplicated.  On POD#1 the CBI was discontinued. he had met discharge criteria: was eating a regular diet, was up and ambulating independently,  pain was well controlled and was ready to for discharge.  He was discharged with an indwelling Foley and will follow-up on 10/4 for catheter removal.   Laboratory values:  Recent Labs    04/03/18 0528  WBC 9.0  HGB 7.6*  HCT 22.4*   Recent Labs    04/03/18 0528  NA 136  K 4.3  CL 107  CO2 26  GLUCOSE 106*  BUN 12  CREATININE 0.81  CALCIUM 7.6*   No results for input(s): LABPT, INR in the last 72 hours. No results for input(s): LABURIN in the last 72 hours. Results for orders placed or performed during the hospital encounter of 03/26/18  Urine culture     Status: Abnormal   Collection Time: 03/26/18 10:05 AM  Result Value Ref Range Status   Specimen Description   Final    URINE, CLEAN CATCH Performed at [facility], [address] [place], [place] [address]    Special Requests   Final    NONE Performed at [facility], [address] [place], [place] [address]    Culture (A)  Final    <10,000 COLONIES/mL INSIGNIFICANT GROWTH Performed at [facility], [address] [place], [place] [address]    Report Status 03/27/2018 FINAL  Final     Disposition: Home  Discharge instruction: The patient was instructed to be ambulatory but told to refrain from heavy lifting, strenuous activity, or driving.   Discharge medications:  Allergies as of 04/03/2018   No Known Allergies     Medication List    TAKE these medications   BLUE-EMU MAXIMUM STRENGTH 2.5 % Liqd Generic drug:  Menthol (Topical Analgesic) Apply 1 application topically 4 (four) times daily as needed (pain in hands.).   CANNABIDIOL PO Place 1 drop under the tongue daily. CBD OIL   ferrous sulfate 325 (65 FE) MG EC tablet Take 1 tablet (325 mg total) by mouth 3 (three) times daily with meals.       Followup:  Follow-up Information    Stoioff, Scott C, MD On 04/18/2018.   Specialty:  Urology Why:  Thursday at 12:45pm post op Contact information: [address] [place]  [address] [phone]           

## 2018-04-08 ENCOUNTER — Other Ambulatory Visit: Payer: Self-pay | Admitting: Radiology

## 2018-04-08 DIAGNOSIS — C679 Malignant neoplasm of bladder, unspecified: Secondary | ICD-10-CM

## 2018-04-08 NOTE — Telephone Encounter (Signed)
Repeat TURBT, possible bilateral retrograde pyelogram, possible bilateral ureteral stent placement scheduled for 04/26/2018 with Dr Bernardo Heater.

## 2018-04-16 ENCOUNTER — Other Ambulatory Visit: Payer: Self-pay | Admitting: Family Medicine

## 2018-04-16 DIAGNOSIS — C679 Malignant neoplasm of bladder, unspecified: Secondary | ICD-10-CM

## 2018-04-16 DIAGNOSIS — R310 Gross hematuria: Secondary | ICD-10-CM

## 2018-04-17 ENCOUNTER — Other Ambulatory Visit: Payer: Medicare Other

## 2018-04-17 DIAGNOSIS — R310 Gross hematuria: Secondary | ICD-10-CM

## 2018-04-17 DIAGNOSIS — C679 Malignant neoplasm of bladder, unspecified: Secondary | ICD-10-CM

## 2018-04-18 ENCOUNTER — Ambulatory Visit: Payer: Medicare Other | Admitting: Urology

## 2018-04-18 LAB — CBC WITH DIFFERENTIAL/PLATELET
Basophils Absolute: 0.1 10*3/uL (ref 0.0–0.2)
Basos: 1 %
EOS (ABSOLUTE): 0.5 10*3/uL — ABNORMAL HIGH (ref 0.0–0.4)
Eos: 6 %
Hematocrit: 35 % — ABNORMAL LOW (ref 37.5–51.0)
Hemoglobin: 11.4 g/dL — ABNORMAL LOW (ref 13.0–17.7)
Immature Grans (Abs): 0.1 10*3/uL (ref 0.0–0.1)
Immature Granulocytes: 1 %
Lymphocytes Absolute: 1.7 10*3/uL (ref 0.7–3.1)
Lymphs: 21 %
MCH: 29 pg (ref 26.6–33.0)
MCHC: 32.6 g/dL (ref 31.5–35.7)
MCV: 89 fL (ref 79–97)
Monocytes Absolute: 0.8 10*3/uL (ref 0.1–0.9)
Monocytes: 10 %
Neutrophils Absolute: 5.2 10*3/uL (ref 1.4–7.0)
Neutrophils: 61 %
Platelets: 340 10*3/uL (ref 150–450)
RBC: 3.93 x10E6/uL — ABNORMAL LOW (ref 4.14–5.80)
RDW: 21 % — ABNORMAL HIGH (ref 12.3–15.4)
WBC: 8.4 10*3/uL (ref 3.4–10.8)

## 2018-04-21 LAB — CULTURE, URINE COMPREHENSIVE

## 2018-04-25 MED ORDER — CEFAZOLIN SODIUM-DEXTROSE 2-4 GM/100ML-% IV SOLN: 2.0000 g | INTRAVENOUS | Status: DC

## 2018-04-26 ENCOUNTER — Encounter
Admission: RE | Disposition: A | Discharge status: Home / Self Care | Payer: Self-pay | Source: Ambulatory Visit | Attending: Urology

## 2018-04-26 ENCOUNTER — Ambulatory Visit
Admission: RE | Admit: 2018-04-26 | Discharge: 2018-04-26 | Disposition: A | Discharge status: Home / Self Care | Payer: Medicare Other | Source: Ambulatory Visit | Attending: Urology | Admitting: Urology

## 2018-04-26 ENCOUNTER — Ambulatory Visit: Payer: Medicare Other | Admitting: Anesthesiology

## 2018-04-26 ENCOUNTER — Other Ambulatory Visit: Payer: Self-pay

## 2018-04-26 ENCOUNTER — Encounter: Payer: Self-pay | Admitting: *Deleted

## 2018-04-26 DIAGNOSIS — F17210 Nicotine dependence, cigarettes, uncomplicated: Secondary | ICD-10-CM | POA: Insufficient documentation

## 2018-04-26 DIAGNOSIS — R310 Gross hematuria: Secondary | ICD-10-CM | POA: Diagnosis not present

## 2018-04-26 DIAGNOSIS — D494 Neoplasm of unspecified behavior of bladder: Secondary | ICD-10-CM | POA: Diagnosis not present

## 2018-04-26 DIAGNOSIS — C673 Malignant neoplasm of anterior wall of bladder: Secondary | ICD-10-CM | POA: Diagnosis present

## 2018-04-26 DIAGNOSIS — C679 Malignant neoplasm of bladder, unspecified: Secondary | ICD-10-CM

## 2018-04-26 HISTORY — PX: URETEROSCOPY: SHX842

## 2018-04-26 HISTORY — PX: TRANSURETHRAL RESECTION OF BLADDER TUMOR: SHX2575

## 2018-04-26 HISTORY — PX: CYSTOSCOPY W/ RETROGRADES: SHX1426

## 2018-04-26 HISTORY — PX: CYSTOSCOPY WITH STENT PLACEMENT: SHX5790

## 2018-04-26 SURGERY — TURBT (TRANSURETHRAL RESECTION OF BLADDER TUMOR)
Anesthesia: General | Site: Ureter | Laterality: Right

## 2018-04-26 MED ORDER — PROPOFOL 10 MG/ML IV BOLUS
INTRAVENOUS | Status: DC | PRN
  Administered 2018-04-26: 150 mg via INTRAVENOUS

## 2018-04-26 MED ORDER — FENTANYL CITRATE (PF) 100 MCG/2ML IJ SOLN
25.0000 ug | INTRAMUSCULAR | Status: DC | PRN
  Administered 2018-04-26 (×2): 25 ug via INTRAVENOUS

## 2018-04-26 MED ORDER — FENTANYL CITRATE (PF) 100 MCG/2ML IJ SOLN
INTRAMUSCULAR | Status: AC
  Filled 2018-04-26: qty 2

## 2018-04-26 MED ORDER — HYDROMORPHONE HCL 1 MG/ML IJ SOLN
INTRAMUSCULAR | Status: DC | PRN
  Administered 2018-04-26 (×2): 0.5 mg via INTRAVENOUS

## 2018-04-26 MED ORDER — FAMOTIDINE 20 MG PO TABS
20.0000 mg | ORAL_TABLET | Freq: Once | ORAL | Status: AC
  Administered 2018-04-26: 20 mg via ORAL

## 2018-04-26 MED ORDER — ROCURONIUM BROMIDE 100 MG/10ML IV SOLN
INTRAVENOUS | Status: DC | PRN
  Administered 2018-04-26: 45 mg via INTRAVENOUS
  Administered 2018-04-26: 20 mg via INTRAVENOUS

## 2018-04-26 MED ORDER — PROPOFOL 10 MG/ML IV BOLUS
INTRAVENOUS | Status: AC
  Filled 2018-04-26: qty 20

## 2018-04-26 MED ORDER — LIDOCAINE HCL (PF) 2 % IJ SOLN
INTRAMUSCULAR | Status: AC
  Filled 2018-04-26: qty 10

## 2018-04-26 MED ORDER — SUGAMMADEX SODIUM 200 MG/2ML IV SOLN
INTRAVENOUS | Status: DC | PRN
  Administered 2018-04-26: 300 mg via INTRAVENOUS

## 2018-04-26 MED ORDER — LACTATED RINGERS IV SOLN
INTRAVENOUS | Status: DC
  Administered 2018-04-26: 09:00:00 via INTRAVENOUS

## 2018-04-26 MED ORDER — ONDANSETRON HCL 4 MG/2ML IJ SOLN
INTRAMUSCULAR | Status: DC | PRN
  Administered 2018-04-26: 4 mg via INTRAVENOUS

## 2018-04-26 MED ORDER — HYDROMORPHONE HCL 1 MG/ML IJ SOLN
INTRAMUSCULAR | Status: AC
  Filled 2018-04-26: qty 1

## 2018-04-26 MED ORDER — FAMOTIDINE 20 MG PO TABS
ORAL_TABLET | ORAL | Status: AC
  Filled 2018-04-26: qty 1

## 2018-04-26 MED ORDER — LIDOCAINE HCL (CARDIAC) PF 100 MG/5ML IV SOSY
PREFILLED_SYRINGE | INTRAVENOUS | Status: DC | PRN
  Administered 2018-04-26: 80 mg via INTRAVENOUS

## 2018-04-26 MED ORDER — MIDAZOLAM HCL 2 MG/2ML IJ SOLN
INTRAMUSCULAR | Status: DC | PRN
  Administered 2018-04-26: 2 mg via INTRAVENOUS

## 2018-04-26 MED ORDER — MIDAZOLAM HCL 2 MG/2ML IJ SOLN
INTRAMUSCULAR | Status: AC
  Filled 2018-04-26: qty 2

## 2018-04-26 MED ORDER — CEFAZOLIN SODIUM-DEXTROSE 2-4 GM/100ML-% IV SOLN
INTRAVENOUS | Status: AC
  Filled 2018-04-26: qty 100

## 2018-04-26 MED ORDER — FENTANYL CITRATE (PF) 100 MCG/2ML IJ SOLN
INTRAMUSCULAR | Status: DC | PRN
  Administered 2018-04-26 (×2): 50 ug via INTRAVENOUS

## 2018-04-26 MED ORDER — ONDANSETRON HCL 4 MG/2ML IJ SOLN: 4.0000 mg | Freq: Once | INTRAMUSCULAR | Status: DC | PRN

## 2018-04-26 MED ORDER — IOPAMIDOL (ISOVUE-200) INJECTION 41%
INTRAVENOUS | Status: DC | PRN
  Administered 2018-04-26: 15 mL

## 2018-04-26 MED ORDER — DEXAMETHASONE SODIUM PHOSPHATE 10 MG/ML IJ SOLN
INTRAMUSCULAR | Status: DC | PRN
  Administered 2018-04-26: 5 mg via INTRAVENOUS

## 2018-04-26 SURGICAL SUPPLY — 35 items
BAG DRAIN CYSTO-URO LG1000N (MISCELLANEOUS) ×5 IMPLANT
BAG URINE DRAINAGE (UROLOGICAL SUPPLIES) ×5 IMPLANT
BRUSH SCRUB EZ  4% CHG (MISCELLANEOUS) ×2
BRUSH SCRUB EZ 4% CHG (MISCELLANEOUS) ×3 IMPLANT
CATH FOL 2WAY LX 20X30 (CATHETERS) ×5 IMPLANT
CATH FOLEY 2WAY  5CC 16FR (CATHETERS)
CATH URETL 5X70 OPEN END (CATHETERS) ×5 IMPLANT
CATH URTH 16FR FL 2W BLN LF (CATHETERS) IMPLANT
DRAPE UTILITY 15X26 TOWEL STRL (DRAPES) ×5 IMPLANT
DRSG TELFA 4X3 1S NADH ST (GAUZE/BANDAGES/DRESSINGS) ×5 IMPLANT
ELECT LOOP 22F BIPOLAR SML (ELECTROSURGICAL)
ELECT REM PT RETURN 9FT ADLT (ELECTROSURGICAL)
ELECTRODE LOOP 22F BIPOLAR SML (ELECTROSURGICAL) IMPLANT
ELECTRODE REM PT RTRN 9FT ADLT (ELECTROSURGICAL) IMPLANT
GLOVE BIO SURGEON STRL SZ8 (GLOVE) ×10 IMPLANT
GOWN STANDARD XL  REUSABL (MISCELLANEOUS) ×5 IMPLANT
GOWN STRL REUS W/ TWL LRG LVL3 (GOWN DISPOSABLE) IMPLANT
GOWN STRL REUS W/TWL LRG LVL3 (GOWN DISPOSABLE)
GOWN STRL REUS W/TWL XL LVL3 (GOWN DISPOSABLE) ×5 IMPLANT
GOWN STRL REUS W/TWL XL LVL4 (GOWN DISPOSABLE) ×5 IMPLANT
HOLDER FOLEY CATH W/STRAP (MISCELLANEOUS) ×5 IMPLANT
KIT TURNOVER CYSTO (KITS) ×5 IMPLANT
LOOP CUT BIPOLAR 24F LRG (ELECTROSURGICAL) ×5 IMPLANT
PACK CYSTO AR (MISCELLANEOUS) ×5 IMPLANT
SENSORWIRE 0.038 NOT ANGLED (WIRE) ×5
SET CYSTO W/LG BORE CLAMP LF (SET/KITS/TRAYS/PACK) IMPLANT
SET IRRIG Y TYPE TUR BLADDER L (SET/KITS/TRAYS/PACK) ×5 IMPLANT
SOL .9 NS 3000ML IRR  AL (IV SOLUTION) ×16
SOL .9 NS 3000ML IRR UROMATIC (IV SOLUTION) ×24 IMPLANT
STENT URET 6FRX24 CONTOUR (STENTS) ×5 IMPLANT
STENT URET 6FRX26 CONTOUR (STENTS) IMPLANT
SURGILUBE 2OZ TUBE FLIPTOP (MISCELLANEOUS) ×5 IMPLANT
SYRINGE IRR TOOMEY STRL 70CC (SYRINGE) ×5 IMPLANT
WATER STERILE IRR 1000ML POUR (IV SOLUTION) ×5 IMPLANT
WIRE SENSOR 0.038 NOT ANGLED (WIRE) ×3 IMPLANT

## 2018-04-26 NOTE — Interval H&P Note (Signed)
History and Physical Interval Note:  04/26/2018 10:03 AM  Jared Nolan  has presented today for surgery, with the diagnosis of Bladder cancer  The various methods of treatment have been discussed with the patient and family. After consideration of risks, benefits and other options for treatment, the patient has consented to  Procedure(s): TRANSURETHRAL RESECTION OF BLADDER TUMOR (TURBT) (N/A) CYSTOSCOPY WITH RETROGRADE PYELOGRAM (Bilateral) CYSTOSCOPY WITH STENT PLACEMENT (Bilateral) as a surgical intervention .  The patient's history has been reviewed, patient examined, no change in status, stable for surgery.  I have reviewed the patient's chart and labs.  Questions were answered to the patient's satisfaction.     [place]
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
## 2018-04-26 NOTE — Anesthesia Preprocedure Evaluation (Signed)
Anesthesia Evaluation  Patient identified by MRN, date of birth, ID band Patient awake    Reviewed: Allergy & Precautions, H&P , NPO status , Patient's Chart, lab work & pertinent test results, reviewed documented beta blocker date and time   History of Anesthesia Complications (+) history of anesthetic complications  Airway Mallampati: II  TM Distance: >3 FB Neck ROM: full    Dental  (+) Teeth Intact   Pulmonary neg pulmonary ROS, Current Smoker,    Pulmonary exam normal        Cardiovascular Exercise Tolerance: Good negative cardio ROS Normal cardiovascular exam Rate:Normal     Neuro/Psych negative neurological ROS  negative psych ROS   GI/Hepatic negative GI ROS, Neg liver ROS,   Endo/Other  negative endocrine ROS  Renal/GU negative Renal ROS  negative genitourinary   Musculoskeletal   Abdominal   Peds  Hematology negative hematology ROS (+)   Anesthesia Other Findings   Reproductive/Obstetrics negative OB ROS                             Anesthesia Physical Anesthesia Plan  ASA: II  Anesthesia Plan: General ETT   Post-op Pain Management:    Induction:   PONV Risk Score and Plan:   Airway Management Planned:   Additional Equipment:   Intra-op Plan:   Post-operative Plan:   Informed Consent: I have reviewed the patients History and Physical, chart, labs and discussed the procedure including the risks, benefits and alternatives for the proposed anesthesia with the patient or authorized representative who has indicated his/her understanding and acceptance.     Plan Discussed with: CRNA  Anesthesia Plan Comments:         Anesthesia Quick Evaluation

## 2018-04-26 NOTE — Anesthesia Post-op Follow-up Note (Signed)
Anesthesia QCDR form completed.        

## 2018-04-26 NOTE — Discharge Instructions (Signed)
AMBULATORY SURGERY  °DISCHARGE INSTRUCTIONS ° ° °1) The drugs that you were given will stay in your system until tomorrow so for the next 24 hours you should not: ° °A) Drive an automobile °B) Make any legal decisions °C) Drink any alcoholic beverage ° ° °2) You may resume regular meals tomorrow.  Today it is better to start with liquids and gradually work up to solid foods. ° °You may eat anything you prefer, but it is better to start with liquids, then soup and crackers, and gradually work up to solid foods. ° ° °3) Please notify your doctor immediately if you have any unusual bleeding, trouble breathing, redness and pain at the surgery site, drainage, fever, or pain not relieved by medication. ° ° ° °4) Additional Instructions: ° ° ° ° ° ° ° °Please contact your physician with any problems or Same Day Surgery at [phone], Monday through Friday 6 am to 4 pm, or [place] [facility] [place] Main number at [phone].AMBULATORY SURGERY  °DISCHARGE INSTRUCTIONS ° ° °5) The drugs that you were given will stay in your system until tomorrow so for the next 24 hours you should not: ° °D) Drive an automobile °E) Make any legal decisions °F) Drink any alcoholic beverage ° ° °6) You may resume regular meals tomorrow.  Today it is better to start with liquids and gradually work up to solid foods. ° °You may eat anything you prefer, but it is better to start with liquids, then soup and crackers, and gradually work up to solid foods. ° ° °7) Please notify your doctor immediately if you have any unusual bleeding, trouble breathing, redness and pain at the surgery site, drainage, fever, or pain not relieved by medication. ° ° ° °8) Additional Instructions: ° ° ° ° ° ° ° °Please contact your physician with any problems or Same Day Surgery at [phone], Monday through Friday 6 am to 4 pm, or [place] [facility] [place] Main number at [phone]. °

## 2018-04-26 NOTE — Progress Notes (Signed)
Catheter care discussed with pt and sister  States understanding

## 2018-04-26 NOTE — Anesthesia Procedure Notes (Deleted)
Performed by: Yoan Sallade, CRNA       

## 2018-04-26 NOTE — Anesthesia Procedure Notes (Signed)
Procedure Name: Intubation Date/Time: 04/26/2018 10:33 AM Performed by: Justus Memory, CRNA Pre-anesthesia Checklist: Patient identified, Patient being monitored, Timeout performed, Emergency Drugs available and Suction available Patient Re-evaluated:Patient Re-evaluated prior to induction Oxygen Delivery Method: Circle system utilized Preoxygenation: Pre-oxygenation with 100% oxygen Induction Type: IV induction Ventilation: Mask ventilation without difficulty Laryngoscope Size: Mac and 3 Grade View: Grade III Tube type: Oral Tube size: 7.0 mm Number of attempts: 1 Airway Equipment and Method: Stylet Placement Confirmation: ETT inserted through vocal cords under direct vision,  positive ETCO2 and breath sounds checked- equal and bilateral Secured at: 21 cm Tube secured with: Tape Dental Injury: Teeth and Oropharynx as per pre-operative assessment  Difficulty Due To: Difficulty was anticipated and Difficult Airway- due to anterior larynx Future Recommendations: Recommend- induction with short-acting agent, and alternative techniques readily available

## 2018-04-26 NOTE — Transfer of Care (Signed)
Immediate Anesthesia Transfer of Care Note  Patient: Jared Nolan  Procedure(s) Performed: TRANSURETHRAL RESECTION OF BLADDER TUMOR (TURBT) (N/A Bladder) CYSTOSCOPY WITH RETROGRADE PYELOGRAM (Bilateral Ureter) CYSTOSCOPY WITH STENT PLACEMENT (Right Ureter) URETEROSCOPY (Right Ureter)  Patient Location: PACU  Anesthesia Type:General  Level of Consciousness: sedated  Airway & Oxygen Therapy: Patient Spontanous Breathing and Patient connected to face mask oxygen  Post-op Assessment: Report given to RN and Post -op Vital signs reviewed and stable  Post vital signs: Reviewed and stable  Last Vitals:  Vitals Value Taken Time  BP 136/82 04/26/2018 12:30 PM  Temp    Pulse 80 04/26/2018 12:30 PM  Resp 19 04/26/2018 12:30 PM  SpO2 100 % 04/26/2018 12:30 PM  Vitals shown include unvalidated device data.  Last Pain:  Vitals:   04/26/18 0911  TempSrc: Temporal  PainSc: 0-No pain         Complications: No apparent anesthesia complications

## 2018-04-26 NOTE — Op Note (Signed)
Preoperative diagnosis: 1. Bladder tumor (< 5 cm)  Postoperative diagnosis:  1. Bladder tumor (< 5 cm)  Procedure:  1. Cystoscopy 2. Transurethral resection of bladder tumor (medium) 3. Right retrograde pyelography with interpretation 4. Right ureteroscopy-diagnostic 5. Placement right ureteral stent (6FR/24 cm)  Surgeon: Nicki Reaper C. Stoioff, M.D.  Anesthesia: General  Complications: None  Intraoperative findings:  1. Bladder tumor: 1 cm papillary tumor right upper/lateral wall; 2 cm papillary tumor anterior bladder neck; broad area papillary tumor right bladder base extending posterior-anterior approximately 4 cm; papillary tumor right hemitrigone obscuring right ureteral orifice 2. Right ureteroscopy: Papillary tumor present just inside the resected ureteral orifice and no further proximally within the ureter 3. Retrograde pyelography: Mild right hydronephrosis and hydroureter without filling defect noted  EBL: Minimal  Specimens: 1. Bladder tumor 2. Base of bladder tumor       Indication: JUDGE DUQUE found to have a >6 cm bladder tumor on CT after a one 2-year history of intermittent gross hematuria.  He initially went TURBT on 04/02/2018 however due to size and suboptimal visualization the tumor was incompletely resected.  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  No muscle invasion was noted.  The ureteral orifices were not identified at the time of the initial procedure.  He presents for follow-up TURBT.  After reviewing the management options for treatment, he elected to proceed with the above surgical procedure(s). We have discussed the potential benefits and risks of the procedure, side effects of the proposed treatment, the likelihood of the patient achieving the goals of the procedure, and any potential problems that might occur during the procedure or recuperation. Informed consent has been obtained.  Description of  procedure:  The patient was taken to the operating room and general anesthesia was induced.  The patient was placed in the dorsal lithotomy position, prepped and draped in the usual sterile fashion, and preoperative antibiotics were administered. A preoperative time-out was performed.   A 26 French continuous-flow resectoscope sheath with visual obturator was lubricated and passed under direct vision.  The patient's urethra was examined and was remarkable for mild lateral lobe enlargement/bladder neck elevation.  The bladder was then systematically examined in its entirety with findings as described above  The right lateral wall tumor was resected down to superficial muscle followed by the anterior wall tumor.  Attention was then directed to the broad area on the right posterior wall/bladder base.  This tissue was firm and yellowish and suspicious for muscle invasion.  Attention was then directed to the right hemitrigone.  The left ureteral orifice was easily identified.  Starting at the mid trigone the tissue was resected working laterally.  The ureteral orifice was identified and appeared to be involved with tumor.  Additional loops were taken.  All tumor was removed with irrigation/evacuation.  Additional resection of the right bladder base tumor was obtained and sent as "base of tumor".  Using a laser bridge a 0.035 guidewire was placed into the right ureteral orifice and advanced proximally.  The resectoscope was removed.  A 4.5 French semirigid ureteroscope was then passed per urethra.  Right ureteroscopy was performed and the tumor was only at the distal margin of the ureter at the bladder junction.  The ureteroscope was removed.  The resectoscope was repassed.  A 6 French open-ended ureteral catheter was placed over the guidewire and the tumor at the distal ureteral margin was resected.  Hemostasis was obtained with cautery.  No bleeding was noted and  the resectoscope was removed.  The  guidewire was backloaded on the cystoscope and a 6 French/24 cm double-J ureteral stent was placed.  Good positioning proximally and distally was noted.  A 20 French Foley catheter was placed with return of light pink effluent upon irrigation.  The catheter was placed to gravity drainage.  The patient appeared to tolerate the procedure well and without complications.  The patient was able to be awakened and transferred to the recovery unit in satisfactory condition.   Plan: 1.  He will be discharged with an indwelling Foley catheter and return to the office on 10/28 for catheter removal  2.  Final recommendations for his urothelial carcinoma pending pathology.  Scott C. Bernardo Heater,  MD

## 2018-04-29 ENCOUNTER — Ambulatory Visit (INDEPENDENT_AMBULATORY_CARE_PROVIDER_SITE_OTHER): Payer: Medicare Other | Admitting: Family Medicine

## 2018-04-29 ENCOUNTER — Encounter: Payer: Self-pay | Admitting: Urology

## 2018-04-29 DIAGNOSIS — C679 Malignant neoplasm of bladder, unspecified: Secondary | ICD-10-CM | POA: Diagnosis not present

## 2018-04-29 LAB — BLADDER SCAN AMB NON-IMAGING: Scan Result: 58

## 2018-04-29 NOTE — Progress Notes (Signed)
Catheter Removal  Patient is present today for a catheter removal.  20ml of water was drained from the balloon. A 20FR foley cath was removed from the bladder no complications were noted . Patient tolerated well.  Preformed by: Elberta Leatherwood, CMA  Follow up/ Additional notes: Return at 2:30pm for PVR  Bladder Scan Patient can void: 58 ml Performed By: Elberta Leatherwood, CMA

## 2018-04-30 LAB — SURGICAL PATHOLOGY

## 2018-05-06 NOTE — Anesthesia Postprocedure Evaluation (Signed)
Anesthesia Post Note  Patient: TIMMY BUBECK  Procedure(s) Performed: TRANSURETHRAL RESECTION OF BLADDER TUMOR (TURBT) (N/A Bladder) CYSTOSCOPY WITH RETROGRADE PYELOGRAM (Bilateral Ureter) CYSTOSCOPY WITH STENT PLACEMENT (Right Ureter) URETEROSCOPY (Right Ureter)  Patient location during evaluation: PACU Anesthesia Type: General Level of consciousness: awake and alert Pain management: pain level controlled Vital Signs Assessment: post-procedure vital signs reviewed and stable Respiratory status: spontaneous breathing, nonlabored ventilation, respiratory function stable and patient connected to nasal cannula oxygen Cardiovascular status: blood pressure returned to baseline and stable Postop Assessment: no apparent nausea or vomiting Anesthetic complications: no     Last Vitals:  Vitals:   04/26/18 1327 04/26/18 1403  BP: 138/80 135/74  Pulse: 77 70  Resp: 16   Temp: 36.6 C   SpO2: 97% 97%    Last Pain:  Vitals:   04/29/18 0807  TempSrc:   PainSc: 0-No pain                 Molli Barrows

## 2018-05-10 ENCOUNTER — Ambulatory Visit (INDEPENDENT_AMBULATORY_CARE_PROVIDER_SITE_OTHER): Payer: Medicare Other | Admitting: Urology

## 2018-05-10 ENCOUNTER — Encounter: Payer: Self-pay | Admitting: Urology

## 2018-05-10 VITALS — BP 168/84 | HR 73 | Ht 67.0 in | Wt 166.6 lb

## 2018-05-10 DIAGNOSIS — C679 Malignant neoplasm of bladder, unspecified: Secondary | ICD-10-CM
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
## 2018-05-17 ENCOUNTER — Telehealth: Payer: Self-pay

## 2018-05-17 NOTE — Telephone Encounter (Signed)
Spoke with patient and explained BCG treatments with him and got him scheduled for 6 full dose induction treatments starting 05-20-18 @2pm . Patient verbalized understanding

## 2018-05-20 ENCOUNTER — Ambulatory Visit (INDEPENDENT_AMBULATORY_CARE_PROVIDER_SITE_OTHER): Payer: Medicare Other | Admitting: Family Medicine

## 2018-05-20 DIAGNOSIS — C679 Malignant neoplasm of bladder, unspecified: Secondary | ICD-10-CM

## 2018-05-20 LAB — URINALYSIS, COMPLETE
BILIRUBIN UA: NEGATIVE
Glucose, UA: NEGATIVE
Ketones, UA: NEGATIVE
Nitrite, UA: NEGATIVE
SPEC GRAV UA: 1.015 (ref 1.005–1.030)
Urobilinogen, Ur: 0.2 mg/dL (ref 0.2–1.0)
pH, UA: 6 (ref 5.0–7.5)

## 2018-05-20 LAB — MICROSCOPIC EXAMINATION
Epithelial Cells (non renal): NONE SEEN /hpf (ref 0–10)
RBC, UA: >30 /hpf — ABNORMAL HIGH (ref 0–2)

## 2018-05-20 NOTE — Progress Notes (Signed)
Patient presents today for BCG #1. The UA did not look ok to proceed. A UCX was ordered and we will see what the result is before sending any medication.

## 2018-05-22 ENCOUNTER — Telehealth: Payer: Self-pay | Admitting: Urology

## 2018-05-22 NOTE — Telephone Encounter (Signed)
Spoke with patient and notified him that he should follow up with PCP in regards to mouth sores. He states he is not running a fever and does not have PCP. He was instructed to try Acute Care or Urgent Care for further evaluation

## 2018-05-22 NOTE — Telephone Encounter (Signed)
Pt's sister, Suanne Marker stopped by asking about pt's white blood count and also he has some kind of places in his mouth.  She doesn't know if it's thrush or what.  He says it's hard to even swallow water.  Please call her [phone] or call pt.

## 2018-05-24 ENCOUNTER — Other Ambulatory Visit: Payer: Self-pay | Admitting: Urology

## 2018-05-24 MED ORDER — AMOXICILLIN 875 MG PO TABS: 875.0000 mg | ORAL_TABLET | Freq: Two times a day (BID) | ORAL | 0 refills | Status: DC

## 2018-05-24 NOTE — Progress Notes (Signed)
Scheduled for BCG on 11/25.  Preliminary urine culture growing low colony count of gram-positive cocci.  Rx amoxicillin was sent to pharmacy.  Should be okay for BCG.

## 2018-05-25 LAB — CULTURE, URINE COMPREHENSIVE

## 2018-05-27 ENCOUNTER — Ambulatory Visit: Payer: Medicare Other

## 2018-05-27 DIAGNOSIS — C679 Malignant neoplasm of bladder, unspecified: Secondary | ICD-10-CM

## 2018-05-27 LAB — MICROSCOPIC EXAMINATION: Epithelial Cells (non renal): NONE SEEN /hpf (ref 0–10)

## 2018-05-27 LAB — URINALYSIS, COMPLETE
BILIRUBIN UA: NEGATIVE
GLUCOSE, UA: NEGATIVE
Ketones, UA: NEGATIVE
NITRITE UA: NEGATIVE
PH UA: 5.5 (ref 5.0–7.5)
Specific Gravity, UA: 1.02 (ref 1.005–1.030)
Urobilinogen, Ur: 0.2 mg/dL (ref 0.2–1.0)

## 2018-05-27 NOTE — Progress Notes (Signed)
Patient was notified of urine culture results and states that he did not start abx until Saturday per Dr. Diamantina Providence urine today still looks suspicious for infection will reculture and defer treatment to next week

## 2018-05-29 ENCOUNTER — Telehealth: Payer: Self-pay | Admitting: Urology

## 2018-05-29 LAB — CULTURE, URINE COMPREHENSIVE

## 2018-05-29 NOTE — Telephone Encounter (Signed)
Urine culture was negative-follow-up next week for BCG

## 2018-06-03 ENCOUNTER — Ambulatory Visit (INDEPENDENT_AMBULATORY_CARE_PROVIDER_SITE_OTHER): Payer: Medicare Other | Admitting: Urology

## 2018-06-03 DIAGNOSIS — C679 Malignant neoplasm of bladder, unspecified: Secondary | ICD-10-CM

## 2018-06-03 LAB — URINALYSIS, COMPLETE
BILIRUBIN UA: NEGATIVE
GLUCOSE, UA: NEGATIVE
NITRITE UA: NEGATIVE
Specific Gravity, UA: 1.02 (ref 1.005–1.030)
UUROB: 0.2 mg/dL (ref 0.2–1.0)
pH, UA: 5.5 (ref 5.0–7.5)

## 2018-06-03 LAB — MICROSCOPIC EXAMINATION: Epithelial Cells (non renal): NONE SEEN /hpf (ref 0–10)

## 2018-06-03 MED ORDER — BCG LIVE 50 MG IS SUSR
3.2400 mL | Freq: Once | INTRAVESICAL | Status: AC
  Administered 2018-06-03: 81 mg via INTRAVESICAL

## 2018-06-03 NOTE — Progress Notes (Signed)
BCG Bladder Instillation  BCG # 1   Due to Bladder Cancer patient is present today for a BCG treatment. Patient was cleaned and prepped in a sterile fashion with betadine and lidocaine 2% jelly was instilled into the urethra.  A 14 FR catheter was inserted, urine return was noted 50 ml, urine was dark yellow in color.  83ml of reconstituted BCG was instilled into the bladder. The catheter was then removed. Patient tolerated well, no complications were noted  Preformed by: Zara Council, PA-C and Fonnie Jarvis, CMA  Follow up/ Additional notes: return in one week for # 2 BCG

## 2018-06-10 ENCOUNTER — Ambulatory Visit (INDEPENDENT_AMBULATORY_CARE_PROVIDER_SITE_OTHER): Payer: Medicare Other

## 2018-06-10 DIAGNOSIS — C679 Malignant neoplasm of bladder, unspecified: Secondary | ICD-10-CM | POA: Diagnosis not present

## 2018-06-10 LAB — MICROSCOPIC EXAMINATION

## 2018-06-10 LAB — URINALYSIS, COMPLETE
BILIRUBIN UA: NEGATIVE
GLUCOSE, UA: NEGATIVE
KETONES UA: NEGATIVE
Nitrite, UA: NEGATIVE
SPEC GRAV UA: 1.025 (ref 1.005–1.030)
Urobilinogen, Ur: 0.2 mg/dL (ref 0.2–1.0)
pH, UA: 5.5 (ref 5.0–7.5)

## 2018-06-10 MED ORDER — BCG LIVE 50 MG IS SUSR
3.2400 mL | Freq: Once | INTRAVESICAL | Status: AC
  Administered 2018-06-10: 81 mg via INTRAVESICAL

## 2018-06-10 NOTE — Progress Notes (Signed)
BCG Bladder Instillation  BCG # 2  Due to Bladder Cancer patient is present today for a BCG treatment. Patient was cleaned and prepped in a sterile fashion with betadine and lidocaine 2% jelly was instilled into the urethra.  A 14FR catheter was inserted, urine return was noted 63ml, urine was yellow in color.  60ml of reconstituted BCG was instilled into the bladder. The catheter was then removed. Patient tolerated well, no complications were noted  Preformed by: Fonnie Jarvis, CMA and Orma Flaming, CMA  Follow up/ Additional notes: next week

## 2018-06-17 ENCOUNTER — Ambulatory Visit (INDEPENDENT_AMBULATORY_CARE_PROVIDER_SITE_OTHER): Payer: Medicare Other

## 2018-06-17 DIAGNOSIS — C679 Malignant neoplasm of bladder, unspecified: Secondary | ICD-10-CM | POA: Diagnosis not present

## 2018-06-17 LAB — URINALYSIS, COMPLETE
BILIRUBIN UA: NEGATIVE
Glucose, UA: NEGATIVE
Ketones, UA: NEGATIVE
Nitrite, UA: NEGATIVE
PH UA: 6 (ref 5.0–7.5)
Protein, UA: NEGATIVE
Specific Gravity, UA: 1.01 (ref 1.005–1.030)
Urobilinogen, Ur: 0.2 mg/dL (ref 0.2–1.0)

## 2018-06-17 LAB — MICROSCOPIC EXAMINATION

## 2018-06-17 MED ORDER — BCG LIVE 50 MG IS SUSR
3.2400 mL | Freq: Once | INTRAVESICAL | Status: AC
  Administered 2018-06-17: 81 mg via INTRAVESICAL

## 2018-06-17 NOTE — Progress Notes (Signed)
BCG Bladder Instillation  BCG # 3  Due to Bladder Cancer patient is present today for a BCG treatment. Patient was cleaned and prepped in a sterile fashion with betadine and lidocaine 2% jelly was instilled into the urethra.  A 14FR catheter was inserted, urine return was noted 93ml, urine was yellow in color.  32ml of reconstituted BCG was instilled into the bladder. The catheter was then removed. Patient tolerated well, no complications were noted  Preformed by: Fonnie Jarvis, CMA   Follow up/ Additional notes: 1 week

## 2018-06-24 ENCOUNTER — Ambulatory Visit (INDEPENDENT_AMBULATORY_CARE_PROVIDER_SITE_OTHER): Payer: Medicare Other | Admitting: Family Medicine

## 2018-06-24 DIAGNOSIS — C679 Malignant neoplasm of bladder, unspecified: Secondary | ICD-10-CM

## 2018-06-24 LAB — MICROSCOPIC EXAMINATION

## 2018-06-24 LAB — URINALYSIS, COMPLETE
Bilirubin, UA: NEGATIVE
GLUCOSE, UA: NEGATIVE
KETONES UA: NEGATIVE
Nitrite, UA: POSITIVE — AB
SPEC GRAV UA: 1.025 (ref 1.005–1.030)
Urobilinogen, Ur: 0.2 mg/dL (ref 0.2–1.0)
pH, UA: 5.5 (ref 5.0–7.5)

## 2018-06-24 NOTE — Progress Notes (Signed)
A urine culture was sent today. The BCG was not performed.

## 2018-06-24 NOTE — Addendum Note (Signed)
Addended by: Kyra Manges on: 06/24/2018 02:53 PM   Modules accepted: Orders

## 2018-06-27 LAB — CULTURE, URINE COMPREHENSIVE

## 2018-07-04 ENCOUNTER — Telehealth: Payer: Self-pay | Admitting: Urology

## 2018-07-04 MED ORDER — NITROFURANTOIN MONOHYD MACRO 100 MG PO CAPS: 100.0000 mg | ORAL_CAPSULE | Freq: Two times a day (BID) | ORAL | 0 refills | Status: DC

## 2018-07-04 NOTE — Telephone Encounter (Signed)
Spoke with pt and informed him of results per Larene Beach, pt agreed to medication and is ok with someone call him to schedule BCG once we get it in   [place], [place] A, PA-C  [facility]        Please have Jared Nolan start nitrofurantoin 100 mg BID for seven days. He also needs to have another BCG, so we need to get it scheduled for next week.     Larene Beach, at this time we are unable to schedule pt for BCG as we are unsure when we will get it

## 2018-07-04 NOTE — Telephone Encounter (Signed)
He is currently in the middle of an induction series of BCG, he should have some set aside for him

## 2018-07-05 NOTE — Telephone Encounter (Signed)
Patient has BCG scheduled.

## 2018-07-08 ENCOUNTER — Ambulatory Visit (INDEPENDENT_AMBULATORY_CARE_PROVIDER_SITE_OTHER): Payer: Medicare Other | Admitting: Family Medicine

## 2018-07-08 DIAGNOSIS — C679 Malignant neoplasm of bladder, unspecified: Secondary | ICD-10-CM

## 2018-07-08 LAB — URINALYSIS, COMPLETE
BILIRUBIN UA: NEGATIVE
GLUCOSE, UA: NEGATIVE
Ketones, UA: NEGATIVE
Nitrite, UA: NEGATIVE
PH UA: 6 (ref 5.0–7.5)
PROTEIN UA: NEGATIVE
Specific Gravity, UA: 1.02 (ref 1.005–1.030)
Urobilinogen, Ur: 0.2 mg/dL (ref 0.2–1.0)

## 2018-07-08 LAB — MICROSCOPIC EXAMINATION

## 2018-07-08 MED ORDER — BCG LIVE 50 MG IS SUSR
3.2400 mL | Freq: Once | INTRAVESICAL | Status: AC
  Administered 2018-07-08: 81 mg via INTRAVESICAL

## 2018-07-08 NOTE — Progress Notes (Signed)
BCG Bladder Instillation  BCG # 4  Due to Bladder Cancer patient is present today for a BCG treatment. Patient was cleaned and prepped in a sterile fashion with betadine and lidocaine 2% jelly was instilled into the urethra.  A 14FR catheter was inserted, urine return was noted 47ml, urine was yellow in color.  68ml of reconstituted BCG was instilled into the bladder. The catheter was then removed. Patient tolerated well, no complications were noted  Preformed by: Elberta Leatherwood, CMA  Follow up/ Additional notes: 1 week #5

## 2018-07-15 ENCOUNTER — Ambulatory Visit (INDEPENDENT_AMBULATORY_CARE_PROVIDER_SITE_OTHER): Payer: Medicare Other | Admitting: Family Medicine

## 2018-07-15 DIAGNOSIS — C679 Malignant neoplasm of bladder, unspecified: Secondary | ICD-10-CM | POA: Diagnosis not present

## 2018-07-15 LAB — URINALYSIS, COMPLETE
BILIRUBIN UA: NEGATIVE
Glucose, UA: NEGATIVE
Ketones, UA: NEGATIVE
NITRITE UA: NEGATIVE
Protein, UA: NEGATIVE
Specific Gravity, UA: 1.02 (ref 1.005–1.030)
UUROB: 0.2 mg/dL (ref 0.2–1.0)
pH, UA: 5.5 (ref 5.0–7.5)

## 2018-07-15 LAB — MICROSCOPIC EXAMINATION

## 2018-07-15 MED ORDER — BCG LIVE 50 MG IS SUSR
3.2400 mL | Freq: Once | INTRAVESICAL | Status: AC
  Administered 2018-07-15: 81 mg via INTRAVESICAL

## 2018-07-15 NOTE — Progress Notes (Signed)
BCG Bladder Instillation  BCG # 5  Due to Bladder Cancer patient is present today for a BCG treatment. Patient was cleaned and prepped in a sterile fashion with betadine and lidocaine 2% jelly was instilled into the urethra.  A 14FR catheter was inserted, urine return was noted 96ml, urine was yellow in color.  4ml of reconstituted BCG was instilled into the bladder. The catheter was then removed. Patient tolerated well, no complications were noted  Preformed by: Elberta Leatherwood, CMA  Follow up/ Additional notes: 1 week #6

## 2018-07-22 ENCOUNTER — Ambulatory Visit: Payer: Medicare Other

## 2018-07-22 DIAGNOSIS — C679 Malignant neoplasm of bladder, unspecified: Secondary | ICD-10-CM

## 2018-07-22 LAB — URINALYSIS, COMPLETE
Bilirubin, UA: NEGATIVE
Glucose, UA: NEGATIVE
KETONES UA: NEGATIVE
NITRITE UA: POSITIVE — AB
SPEC GRAV UA: 1.02 (ref 1.005–1.030)
UUROB: 0.2 mg/dL (ref 0.2–1.0)
pH, UA: 5.5 (ref 5.0–7.5)

## 2018-07-22 LAB — MICROSCOPIC EXAMINATION

## 2018-07-22 MED ORDER — BCG LIVE 50 MG IS SUSR: 3.2400 mL | Freq: Once | INTRAVESICAL | Status: DC

## 2018-07-22 MED ORDER — NITROFURANTOIN MONOHYD MACRO 100 MG PO CAPS: 100.0000 mg | ORAL_CAPSULE | Freq: Two times a day (BID) | ORAL | 0 refills | Status: DC

## 2018-07-22 NOTE — Progress Notes (Signed)
Patient present today for a BCG instillation. UA today frankly positive for infection and patient is having frequency and urgency. UA today was sent for culture Per Dr. Diamantina Providence Nitrofurantoin was sent to patient's pharmacy for him to start. Will call with culture results

## 2018-07-24 LAB — CULTURE, URINE COMPREHENSIVE

## 2018-07-29 ENCOUNTER — Ambulatory Visit (INDEPENDENT_AMBULATORY_CARE_PROVIDER_SITE_OTHER): Payer: Medicare Other | Admitting: Family Medicine

## 2018-07-29 DIAGNOSIS — C679 Malignant neoplasm of bladder, unspecified: Secondary | ICD-10-CM | POA: Diagnosis not present

## 2018-07-29 LAB — URINALYSIS, COMPLETE
BILIRUBIN UA: NEGATIVE
Glucose, UA: NEGATIVE
Ketones, UA: NEGATIVE
Nitrite, UA: NEGATIVE
PH UA: 6 (ref 5.0–7.5)
PROTEIN UA: NEGATIVE
RBC, UA: NEGATIVE
Specific Gravity, UA: 1.015 (ref 1.005–1.030)
Urobilinogen, Ur: 0.2 mg/dL (ref 0.2–1.0)

## 2018-07-29 LAB — MICROSCOPIC EXAMINATION
BACTERIA UA: NONE SEEN
RBC, UA: NONE SEEN /hpf (ref 0–2)

## 2018-07-29 MED ORDER — BCG LIVE 50 MG IS SUSR
3.2400 mL | Freq: Once | INTRAVESICAL | Status: AC
  Administered 2018-07-29: 81 mg via INTRAVESICAL

## 2018-07-29 NOTE — Progress Notes (Signed)
BCG Bladder Instillation  BCG # 6  Due to Bladder Cancer patient is present today for a BCG treatment. Patient was cleaned and prepped in a sterile fashion with betadine and lidocaine 2% jelly was instilled into the urethra.  A 14FR catheter was inserted, urine return was noted 179ml, urine was yellow in color.  36ml of reconstituted BCG was instilled into the bladder. The catheter was then removed. Patient tolerated well, no complications were noted  Preformed by: Elberta Leatherwood, CMA  Follow up/ Additional notes: 3 months for Cysto

## 2018-10-22 ENCOUNTER — Other Ambulatory Visit: Payer: Medicare Other | Admitting: Urology

## 2018-10-23 ENCOUNTER — Encounter: Payer: Self-pay | Admitting: Urology

## 2018-11-01 ENCOUNTER — Other Ambulatory Visit: Payer: Medicare Other | Admitting: Urology

## 2018-11-21 ENCOUNTER — Telehealth: Payer: Self-pay | Admitting: Urology

## 2018-11-21 ENCOUNTER — Other Ambulatory Visit: Payer: Self-pay

## 2018-11-21 ENCOUNTER — Other Ambulatory Visit: Payer: Medicare Other

## 2018-11-21 DIAGNOSIS — R300 Dysuria: Secondary | ICD-10-CM

## 2018-11-21 LAB — MICROSCOPIC EXAMINATION: Epithelial Cells (non renal): NONE SEEN /hpf (ref 0–10)

## 2018-11-21 LAB — URINALYSIS, COMPLETE
Bilirubin, UA: NEGATIVE
Glucose, UA: NEGATIVE
Ketones, UA: NEGATIVE
Nitrite, UA: POSITIVE — AB
Specific Gravity, UA: 1.015 (ref 1.005–1.030)
Urobilinogen, Ur: 0.2 mg/dL (ref 0.2–1.0)
pH, UA: 6 (ref 5.0–7.5)

## 2018-11-21 MED ORDER — NITROFURANTOIN MONOHYD MACRO 100 MG PO CAPS: 100.0000 mg | ORAL_CAPSULE | Freq: Two times a day (BID) | ORAL | 0 refills | Status: DC

## 2018-11-21 NOTE — Progress Notes (Signed)
Per Dr. Diamantina Providence patient notified that UA today does look positive for infection, Nitrofuration 100mg  BID for 10days sent in and urine sent for culture

## 2018-11-21 NOTE — Telephone Encounter (Signed)
Pt came to office for appt, (he got days mixed up) but thinks he has an infection.  He left a urine sample.  He is having urgency and frequency.

## 2018-11-21 NOTE — Telephone Encounter (Signed)
Orders in 

## 2018-11-23 LAB — CULTURE, URINE COMPREHENSIVE

## 2018-12-03 ENCOUNTER — Encounter: Payer: Self-pay | Admitting: Urology

## 2018-12-03 ENCOUNTER — Ambulatory Visit (INDEPENDENT_AMBULATORY_CARE_PROVIDER_SITE_OTHER): Payer: Medicare Other | Admitting: Urology

## 2018-12-03 ENCOUNTER — Other Ambulatory Visit: Payer: Self-pay

## 2018-12-03 VITALS — BP 148/100 | HR 75 | Ht 67.0 in | Wt 163.6 lb

## 2018-12-03 DIAGNOSIS — C679 Malignant neoplasm of bladder, unspecified: Secondary | ICD-10-CM

## 2018-12-03 LAB — URINALYSIS, COMPLETE
Bilirubin, UA: NEGATIVE
Glucose, UA: NEGATIVE
Ketones, UA: NEGATIVE
Nitrite, UA: NEGATIVE
Specific Gravity, UA: 1.02 (ref 1.005–1.030)
Urobilinogen, Ur: 0.2 mg/dL (ref 0.2–1.0)
pH, UA: 5.5 (ref 5.0–7.5)

## 2018-12-03 LAB — MICROSCOPIC EXAMINATION: Bacteria, UA: NONE SEEN

## 2018-12-03 MED ORDER — LIDOCAINE HCL URETHRAL/MUCOSAL 2 % EX GEL
1.0000 "application " | Freq: Once | CUTANEOUS | Status: AC
  Administered 2018-12-03: 1 via URETHRAL

## 2018-12-05 ENCOUNTER — Encounter: Payer: Self-pay | Admitting: Urology

## 2018-12-05 NOTE — Progress Notes (Signed)
   12/05/18  CC:  Chief Complaint  Patient presents with  . 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 Jared Nolan presents for surveillance cystoscopy.  He has no complaints  Blood pressure (!) 148/100, pulse 75, height 5\' 7"  (1.702 m), weight 163 lb 9.6 oz (74.2 kg). NED. A&Ox3.   No respiratory distress   Abd soft, NT, ND Normal phallus with bilateral descended testicles  Cystoscopy Procedure Note  Patient identification was confirmed, informed consent was obtained, and patient was prepped using Betadine solution.  Lidocaine jelly was administered per urethral meatus.     Pre-Procedure: - Inspection reveals a normal caliber ureteral meatus.  Procedure: The flexible cystoscope was introduced without difficulty - No urethral strictures/lesions are present. - Moderate lateral lobe enlargement prostate  - Mild elevation bladder neck - Bilateral ureteral orifices identified.  Bullous edema right hemitrigone secondary to indwelling stent -Otherwise bladder mucosa  reveals no ulcers, tumors, or lesions - No bladder stones -Mild trabeculation  Retroflexion shows no visible tumor Ureteral stent was grasped endoscopic forceps and removed without difficulty   Post-Procedure: - Patient tolerated the procedure well  Assessment/ Plan: No evidence of recurrent bladder tumor.  I recommended  scheduling a 3-week maintenance BCG treatment and follow-up surveillance cystoscopy in 3 months.  Return in about 3 months (around 03/05/2019) for Cystoscopy.   Abbie Sons, MD

## 2018-12-06 ENCOUNTER — Other Ambulatory Visit: Payer: Self-pay

## 2018-12-06 ENCOUNTER — Ambulatory Visit (INDEPENDENT_AMBULATORY_CARE_PROVIDER_SITE_OTHER): Payer: Medicare Other

## 2018-12-06 DIAGNOSIS — C679 Malignant neoplasm of bladder, unspecified: Secondary | ICD-10-CM

## 2018-12-06 LAB — URINALYSIS, COMPLETE
Bilirubin, UA: NEGATIVE
Glucose, UA: NEGATIVE
Ketones, UA: NEGATIVE
Leukocytes,UA: NEGATIVE
Nitrite, UA: NEGATIVE
Protein,UA: NEGATIVE
RBC, UA: NEGATIVE
Specific Gravity, UA: 1.015 (ref 1.005–1.030)
Urobilinogen, Ur: 0.2 mg/dL (ref 0.2–1.0)
pH, UA: 5.5 (ref 5.0–7.5)

## 2018-12-06 LAB — MICROSCOPIC EXAMINATION
Bacteria, UA: NONE SEEN
RBC: NONE SEEN /hpf (ref 0–2)

## 2018-12-06 MED ORDER — BCG LIVE 50 MG IS SUSR
3.2400 mL | Freq: Once | INTRAVESICAL | Status: AC
  Administered 2018-12-06: 81 mg via INTRAVESICAL

## 2018-12-06 NOTE — Progress Notes (Signed)
BCG Bladder Instillation  BCG # 1 Maint.  Due to Bladder Cancer patient is present today for a BCG treatment. Patient was cleaned and prepped in a sterile fashion with betadine and lidocaine 2% jelly was instilled into the urethra.  A 14FR catheter was inserted, urine return was noted 47ml, urine was yellow in color.  70ml of reconstituted BCG was instilled into the bladder. The catheter was then removed. Patient tolerated well, no complications were noted  Preformed by: Alva Garnet and Darrick Grinder, CMA  Follow up/ Additional notes: next week

## 2018-12-06 NOTE — Addendum Note (Signed)
Addended by: Tommy Rainwater on: 12/06/2018 12:13 PM   Modules accepted: Orders

## 2018-12-09 ENCOUNTER — Other Ambulatory Visit: Payer: Self-pay | Admitting: Urology

## 2018-12-13 ENCOUNTER — Ambulatory Visit (INDEPENDENT_AMBULATORY_CARE_PROVIDER_SITE_OTHER): Payer: Medicare Other

## 2018-12-13 ENCOUNTER — Other Ambulatory Visit: Payer: Self-pay

## 2018-12-13 DIAGNOSIS — C679 Malignant neoplasm of bladder, unspecified: Secondary | ICD-10-CM

## 2018-12-13 LAB — URINALYSIS, COMPLETE
Bilirubin, UA: NEGATIVE
Glucose, UA: NEGATIVE
Ketones, UA: NEGATIVE
Leukocytes,UA: NEGATIVE
Nitrite, UA: NEGATIVE
Protein,UA: NEGATIVE
RBC, UA: NEGATIVE
Specific Gravity, UA: 1.015 (ref 1.005–1.030)
Urobilinogen, Ur: 0.2 mg/dL (ref 0.2–1.0)
pH, UA: 5.5 (ref 5.0–7.5)

## 2018-12-13 LAB — MICROSCOPIC EXAMINATION
Bacteria, UA: NONE SEEN
RBC, Urine: NONE SEEN /hpf (ref 0–2)

## 2018-12-13 MED ORDER — BCG LIVE 50 MG IS SUSR
3.2400 mL | Freq: Once | INTRAVESICAL | Status: AC
  Administered 2018-12-13: 81 mg via INTRAVESICAL

## 2018-12-13 NOTE — Progress Notes (Signed)
BCG Bladder Instillation  BCG # 2  Due to Bladder Cancer patient is present today for a BCG treatment. Patient was cleaned and prepped in a sterile fashion with betadine and lidocaine 2% jelly was instilled into the urethra.  A 14FR catheter was inserted, urine return was noted 27ml, urine was yellow in color.  66ml of reconstituted BCG was instilled into the bladder. The catheter was then removed. Patient tolerated well, no complications were noted  Preformed by: Gordy Clement, [place], CMA  Follow up/ Additional notes: RTC as scheduled

## 2018-12-20 ENCOUNTER — Other Ambulatory Visit: Payer: Self-pay

## 2018-12-20 ENCOUNTER — Ambulatory Visit (INDEPENDENT_AMBULATORY_CARE_PROVIDER_SITE_OTHER): Payer: Medicare Other | Admitting: Family Medicine

## 2018-12-20 DIAGNOSIS — C679 Malignant neoplasm of bladder, unspecified: Secondary | ICD-10-CM | POA: Diagnosis not present

## 2018-12-20 LAB — URINALYSIS, COMPLETE
Bilirubin, UA: NEGATIVE
Glucose, UA: NEGATIVE
Ketones, UA: NEGATIVE
Leukocytes,UA: NEGATIVE
Nitrite, UA: NEGATIVE
Protein,UA: NEGATIVE
RBC, UA: NEGATIVE
Specific Gravity, UA: 1.02 (ref 1.005–1.030)
Urobilinogen, Ur: 0.2 mg/dL (ref 0.2–1.0)
pH, UA: 6 (ref 5.0–7.5)

## 2018-12-20 LAB — MICROSCOPIC EXAMINATION: Bacteria, UA: NONE SEEN

## 2018-12-20 MED ORDER — BCG LIVE 50 MG IS SUSR
3.2400 mL | Freq: Once | INTRAVESICAL | Status: AC
  Administered 2018-12-20: 81 mg via INTRAVESICAL

## 2018-12-20 NOTE — Progress Notes (Signed)
BCG Bladder Instillation  BCG # 3  Due to Bladder Cancer patient is present today for a BCG treatment. Patient was cleaned and prepped in a sterile fashion with betadine and lidocaine 2% jelly was instilled into the urethra.  A 14FR catheter was inserted, urine return was noted 127ml, urine was yellow in color.  53ml of reconstituted BCG was instilled into the bladder. The catheter was then removed. Patient tolerated well, no complications were noted  Preformed by: Elberta Leatherwood, CMA, Shawnie Dapper, CMA  Follow up/ Additional notes: 3 months

## 2019-03-21 ENCOUNTER — Other Ambulatory Visit: Payer: Self-pay

## 2019-03-21 ENCOUNTER — Ambulatory Visit (INDEPENDENT_AMBULATORY_CARE_PROVIDER_SITE_OTHER): Payer: Medicare Other | Admitting: Urology

## 2019-03-21 ENCOUNTER — Encounter: Payer: Self-pay | Admitting: Urology

## 2019-03-21 VITALS — BP 169/82 | HR 71 | Ht 67.0 in | Wt 163.0 lb

## 2019-03-21 DIAGNOSIS — C679 Malignant neoplasm of bladder, unspecified: Secondary | ICD-10-CM | POA: Diagnosis not present

## 2019-03-21 LAB — URINALYSIS, COMPLETE
Bilirubin, UA: NEGATIVE
Glucose, UA: NEGATIVE
Ketones, UA: NEGATIVE
Leukocytes,UA: NEGATIVE
Nitrite, UA: NEGATIVE
Protein,UA: NEGATIVE
RBC, UA: NEGATIVE
Specific Gravity, UA: 1.01 (ref 1.005–1.030)
Urobilinogen, Ur: 0.2 mg/dL (ref 0.2–1.0)
pH, UA: 7 (ref 5.0–7.5)

## 2019-03-21 LAB — MICROSCOPIC EXAMINATION
Bacteria, UA: NONE SEEN
RBC, Urine: NONE SEEN /hpf (ref 0–2)

## 2019-03-21 MED ORDER — LIDOCAINE HCL URETHRAL/MUCOSAL 2 % EX GEL
1.0000 "application " | Freq: Once | CUTANEOUS | Status: AC
  Administered 2019-03-21: 1 via URETHRAL

## 2019-03-21 NOTE — Progress Notes (Signed)
   03/21/19  CC: No chief complaint on file.   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  He has no complaints.  There were no vitals taken for this visit. NED. A&Ox3.   No respiratory distress   Abd soft, NT, ND Normal phallus with bilateral descended testicles  Cystoscopy Procedure Note  Patient identification was confirmed, informed consent was obtained, and patient was prepped using Betadine solution.  Lidocaine jelly was administered per urethral meatus.     Pre-Procedure: - Inspection reveals a normal caliber urethral meatus.  Procedure: The flexible cystoscope was introduced without difficulty - No urethral strictures/lesions are present. - Moderate lateral lobe enlargement prostate  - Mild elevation bladder neck - Bilateral ureteral orifices identified; right resected without tumor - Bladder mucosa  reveals no ulcers, tumors, or lesions - No bladder stones - No trabeculation  Retroflexion shows no recurrent tumor   Post-Procedure: - Patient tolerated the procedure well  Assessment/ Plan: -No evidence of recurrent urothelial carcinoma  Return in about 3 months (around 06/20/2019) for Cystoscopy.  Abbie Sons, MD

## 2019-03-23 ENCOUNTER — Encounter: Payer: Self-pay | Admitting: Urology

## 2019-03-28 ENCOUNTER — Other Ambulatory Visit: Payer: Medicare Other | Admitting: Urology

## 2019-06-20 ENCOUNTER — Ambulatory Visit (INDEPENDENT_AMBULATORY_CARE_PROVIDER_SITE_OTHER): Payer: Medicare Other | Admitting: Urology

## 2019-06-20 ENCOUNTER — Encounter: Payer: Self-pay | Admitting: Urology

## 2019-06-20 ENCOUNTER — Other Ambulatory Visit: Payer: Self-pay

## 2019-06-20 VITALS — BP 156/88 | HR 77 | Ht 67.0 in | Wt 171.0 lb

## 2019-06-20 DIAGNOSIS — C679 Malignant neoplasm of bladder, unspecified: Secondary | ICD-10-CM | POA: Diagnosis not present

## 2019-06-20 LAB — URINALYSIS, COMPLETE
Bilirubin, UA: NEGATIVE
Glucose, UA: NEGATIVE
Ketones, UA: NEGATIVE
Leukocytes,UA: NEGATIVE
Nitrite, UA: NEGATIVE
Protein,UA: NEGATIVE
RBC, UA: NEGATIVE
Specific Gravity, UA: 1.02 (ref 1.005–1.030)
Urobilinogen, Ur: 0.2 mg/dL (ref 0.2–1.0)
pH, UA: 6 (ref 5.0–7.5)

## 2019-06-20 LAB — MICROSCOPIC EXAMINATION
Bacteria, UA: NONE SEEN
RBC, Urine: NONE SEEN /hpf (ref 0–2)
WBC, UA: NONE SEEN /hpf (ref 0–5)

## 2019-06-20 NOTE — Progress Notes (Signed)
   06/20/19  CC:  Chief Complaint  Patient presents with  . 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 NED. A&Ox3.   No respiratory distress   Abd soft, NT, ND Normal phallus with bilateral descended testicles  Cystoscopy Procedure Note  Patient identification was confirmed, informed consent was obtained, and patient was prepped using Betadine solution.  Lidocaine jelly was administered per urethral meatus.     Pre-Procedure: - Inspection reveals a normal caliber urethral meatus.  Procedure: The flexible cystoscope was introduced without difficulty - No urethral strictures/lesions are present. - Mild to moderate lateral lobe enlargement prostate  - Mild elevation bladder neck - Bilateral ureteral orifices identified; right resected without tumor - Bladder mucosa  reveals no ulcers, tumors, or lesions - No bladder stones - No trabeculation  Retroflexion shows no abnormalities/tumor   Post-Procedure: - Patient tolerated the procedure well  Assessment/ Plan: -No evidence recurrent urothelial carcinoma -3-week maintenance BCG next month -Cystoscopy 3 months   Jared Sons, MD

## 2019-06-22 ENCOUNTER — Encounter: Payer: Self-pay | Admitting: Urology

## 2019-07-10 ENCOUNTER — Ambulatory Visit (INDEPENDENT_AMBULATORY_CARE_PROVIDER_SITE_OTHER): Payer: Medicare Other | Admitting: Physician Assistant

## 2019-07-10 ENCOUNTER — Other Ambulatory Visit: Payer: Self-pay

## 2019-07-10 ENCOUNTER — Encounter: Payer: Self-pay | Admitting: Physician Assistant

## 2019-07-10 VITALS — BP 163/100 | HR 106 | Ht 67.0 in | Wt 171.0 lb

## 2019-07-10 DIAGNOSIS — C679 Malignant neoplasm of bladder, unspecified: Secondary | ICD-10-CM | POA: Diagnosis not present

## 2019-07-10 MED ORDER — BCG LIVE 50 MG IS SUSR
3.2400 mL | Freq: Once | INTRAVESICAL | Status: AC
  Administered 2019-07-10: 81 mg via INTRAVESICAL

## 2019-07-10 NOTE — Progress Notes (Signed)
BCG Bladder Instillation  BCG # 1 of 3  Due to Bladder Cancer patient is present today for a BCG treatment. Patient was cleaned and prepped in a sterile fashion with betadine and lidocaine 2% jelly was instilled into the urethra.  A 14FR catheter was inserted, urine return was noted 94ml, urine was yellow in color.  11ml of reconstituted BCG was instilled into the bladder. The catheter was then removed. Patient tolerated well, no complications were noted  Performed by: Debroah Loop, PA-C and Verlene Mayer, CMA  Follow up/ Additional notes: Reiterated BCG post-instillation guidance. Patient declined written copy. RTC in 1 week for BCG #2 of 3.

## 2019-07-10 NOTE — Addendum Note (Signed)
Addended by: Mickle Plumb on: 07/10/2019 03:57 PM   Modules accepted: Orders

## 2019-07-11 LAB — URINALYSIS, COMPLETE
Bilirubin, UA: NEGATIVE
Glucose, UA: NEGATIVE
Ketones, UA: NEGATIVE
Leukocytes,UA: NEGATIVE
Nitrite, UA: NEGATIVE
Protein,UA: NEGATIVE
RBC, UA: NEGATIVE
Specific Gravity, UA: 1.015 (ref 1.005–1.030)
Urobilinogen, Ur: 0.2 mg/dL (ref 0.2–1.0)
pH, UA: 7 (ref 5.0–7.5)

## 2019-07-11 LAB — MICROSCOPIC EXAMINATION
Bacteria, UA: NONE SEEN
Epithelial Cells (non renal): NONE SEEN /hpf (ref 0–10)
RBC, Urine: NONE SEEN /hpf (ref 0–2)
WBC, UA: NONE SEEN /hpf (ref 0–5)

## 2019-07-17 ENCOUNTER — Ambulatory Visit (INDEPENDENT_AMBULATORY_CARE_PROVIDER_SITE_OTHER): Payer: Medicare Other | Admitting: Physician Assistant

## 2019-07-17 ENCOUNTER — Other Ambulatory Visit: Payer: Self-pay

## 2019-07-17 ENCOUNTER — Encounter: Payer: Self-pay | Admitting: Physician Assistant

## 2019-07-17 VITALS — BP 162/84 | HR 78 | Ht 67.0 in | Wt 168.0 lb

## 2019-07-17 DIAGNOSIS — C679 Malignant neoplasm of bladder, unspecified: Secondary | ICD-10-CM | POA: Diagnosis not present

## 2019-07-17 LAB — MICROSCOPIC EXAMINATION
Bacteria, UA: NONE SEEN
RBC, Urine: NONE SEEN /hpf (ref 0–2)

## 2019-07-17 LAB — URINALYSIS, COMPLETE
Bilirubin, UA: NEGATIVE
Glucose, UA: NEGATIVE
Ketones, UA: NEGATIVE
Leukocytes,UA: NEGATIVE
Nitrite, UA: NEGATIVE
Protein,UA: NEGATIVE
RBC, UA: NEGATIVE
Specific Gravity, UA: 1.015 (ref 1.005–1.030)
Urobilinogen, Ur: 0.2 mg/dL (ref 0.2–1.0)
pH, UA: 6.5 (ref 5.0–7.5)

## 2019-07-17 MED ORDER — BCG LIVE 50 MG IS SUSR
3.2400 mL | Freq: Once | INTRAVESICAL | Status: AC
  Administered 2019-07-17: 81 mg via INTRAVESICAL

## 2019-07-17 NOTE — Patient Instructions (Signed)

## 2019-07-17 NOTE — Addendum Note (Signed)
Addended by: Tommy Rainwater on: 07/17/2019 02:47 PM   Modules accepted: Orders

## 2019-07-17 NOTE — Progress Notes (Signed)
BCG Bladder Instillation  BCG # 2 of 3  Due to Bladder Cancer patient is present today for a BCG treatment. Patient was cleaned and prepped in a sterile fashion with betadine.  A 14FR catheter was inserted, urine return was noted 47ml, urine was yellow in color.  107ml of reconstituted BCG was instilled into the bladder. The catheter was then removed. Patient tolerated well, no complications were noted  Performed by: Debroah Loop, PA-C and Fonnie Jarvis, CMA  Follow up/ Additional notes: 1 week for BCG #3 of 3

## 2019-07-24 ENCOUNTER — Encounter: Payer: Self-pay | Admitting: Physician Assistant

## 2019-07-24 ENCOUNTER — Other Ambulatory Visit: Payer: Self-pay

## 2019-07-24 ENCOUNTER — Ambulatory Visit (INDEPENDENT_AMBULATORY_CARE_PROVIDER_SITE_OTHER): Payer: Medicare Other | Admitting: Physician Assistant

## 2019-07-24 VITALS — BP 163/89 | HR 97 | Ht 67.0 in | Wt 168.0 lb

## 2019-07-24 DIAGNOSIS — C679 Malignant neoplasm of bladder, unspecified: Secondary | ICD-10-CM

## 2019-07-24 DIAGNOSIS — R310 Gross hematuria: Secondary | ICD-10-CM

## 2019-07-24 MED ORDER — BCG LIVE 50 MG IS SUSR
3.2400 mL | Freq: Once | INTRAVESICAL | Status: AC
  Administered 2019-07-24: 81 mg via INTRAVESICAL

## 2019-07-24 NOTE — Progress Notes (Signed)
BCG Bladder Instillation  BCG # 3 of 3  Due to Bladder Cancer patient is present today for a BCG treatment. Patient was cleaned and prepped in a sterile fashion with betadine. A 14FR catheter was inserted, urine return was noted 58ml, urine was yellow in color.  15ml of reconstituted BCG was instilled into the bladder. The catheter was then removed. Patient tolerated well, no complications were noted  Performed by: Debroah Loop, PA-C and Verlene Mayer, CMA  Follow up/ Additional notes: Cysto scheduled as below.  Future Appointments  Date Time Provider [place]  09/18/2019  2:00 PM Stoioff, Ronda Fairly, MD [facility] None

## 2019-07-25 LAB — URINALYSIS, COMPLETE
Bilirubin, UA: NEGATIVE
Glucose, UA: NEGATIVE
Leukocytes,UA: NEGATIVE
Nitrite, UA: NEGATIVE
Protein,UA: NEGATIVE
RBC, UA: NEGATIVE
Specific Gravity, UA: 1.015 (ref 1.005–1.030)
Urobilinogen, Ur: 0.2 mg/dL (ref 0.2–1.0)
pH, UA: 6 (ref 5.0–7.5)

## 2019-07-25 LAB — MICROSCOPIC EXAMINATION
Bacteria, UA: NONE SEEN
Epithelial Cells (non renal): NONE SEEN /hpf (ref 0–10)
RBC, Urine: NONE SEEN /hpf (ref 0–2)

## 2019-09-18 ENCOUNTER — Other Ambulatory Visit: Payer: Self-pay

## 2019-09-18 ENCOUNTER — Ambulatory Visit (INDEPENDENT_AMBULATORY_CARE_PROVIDER_SITE_OTHER): Payer: Medicare Other | Admitting: Urology

## 2019-09-18 VITALS — BP 163/99 | HR 69 | Ht 67.0 in | Wt 162.0 lb

## 2019-09-18 DIAGNOSIS — C672 Malignant neoplasm of lateral wall of bladder: Secondary | ICD-10-CM | POA: Diagnosis not present

## 2019-09-18 DIAGNOSIS — D494 Neoplasm of unspecified behavior of bladder: Secondary | ICD-10-CM

## 2019-09-18 LAB — URINALYSIS, COMPLETE
Bilirubin, UA: NEGATIVE
Glucose, UA: NEGATIVE
Ketones, UA: NEGATIVE
Leukocytes,UA: NEGATIVE
Nitrite, UA: NEGATIVE
Protein,UA: NEGATIVE
RBC, UA: NEGATIVE
Specific Gravity, UA: 1.02 (ref 1.005–1.030)
Urobilinogen, Ur: 0.2 mg/dL (ref 0.2–1.0)
pH, UA: 6 (ref 5.0–7.5)

## 2019-09-18 LAB — MICROSCOPIC EXAMINATION
Bacteria, UA: NONE SEEN
Epithelial Cells (non renal): NONE SEEN /hpf (ref 0–10)
RBC, Urine: NONE SEEN /hpf (ref 0–2)

## 2019-09-18 MED ORDER — LIDOCAINE HCL URETHRAL/MUCOSAL 2 % EX GEL
1.0000 "application " | Freq: Once | CUTANEOUS | Status: AC
  Administered 2019-09-18: 1 via URETHRAL

## 2019-09-18 NOTE — Progress Notes (Signed)
Cystoscopy Procedure Note:  Indication: Hx of HG T1 UCC  Patient of Dr. Dene Gentry and he was unexpectedly out of clinic today and I offered to fill in.  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 Cystoscopy was performed with a flexible cystoscope. The urethra, bladder neck and entire bladder was visualized in a standard fashion. The prostate was moderate in size. The ureteral orifices were visualized in their normal location and orientation.  Subtle erythema at posterior wall consistent with prior BCG, no definite tumor recurrence.  Findings: No evidence of recurrence  Assessment and Plan: Follow-up for surveillance cystoscopy with Dr. Bernardo Heater and 3 months, if negative due for [facility] July 2021  He continues to smoke and I counseled him extensively that this significantly increases his risk of bladder cancer recurrence  Jared Madrid, MD 09/18/2019

## 2019-09-25 ENCOUNTER — Other Ambulatory Visit: Payer: Self-pay | Admitting: Urology

## 2019-09-26 IMAGING — CT CT ABD-PEL WO/W CM
3 of 12 series · 11 of 46 positions shown, 17 images · IV contrast (iopamidol)
Comparison: None.

CLINICAL DATA: Intermittent gross hematuria for 1 year. Urinary
frequency and urgency. No history of malignancy or prior relevant
surgery.

EXAM:
CT ABDOMEN AND PELVIS WITHOUT AND WITH CONTRAST
TECHNIQUE: Multidetector CT imaging of the abdomen and pelvis was performed
following the standard protocol before and following the bolus
administration of intravenous contrast.
CONTRAST:  125mL QDGLIT-XEE IOPAMIDOL (QDGLIT-XEE) INJECTION 61%

[Series 2: without pre · axial · non-contrast · 0.72mm/px · z∈[-1441,-1381]mm · 2 of 83 slices shown]
[im 12/83  soft-tissue]
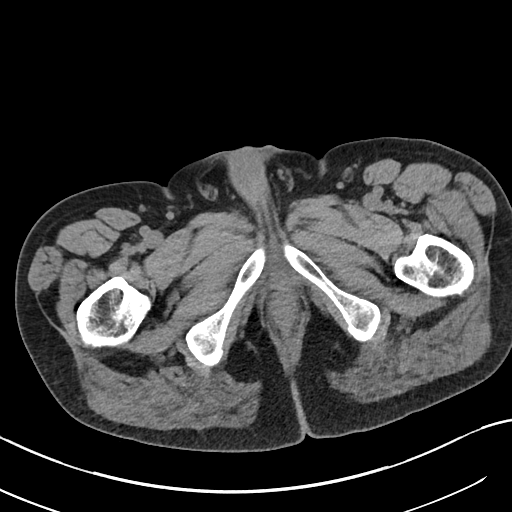
[im 24/83  soft-tissue]
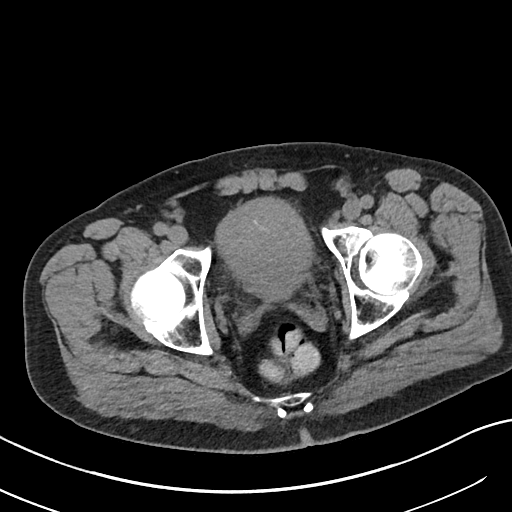

[Series 17: axial delay delay prone · axial · delayed · 0.72mm/px · z∈[-1390,-1030]mm · 7 of 98 slices shown, 12 images]
[im 13/98  soft-tissue]
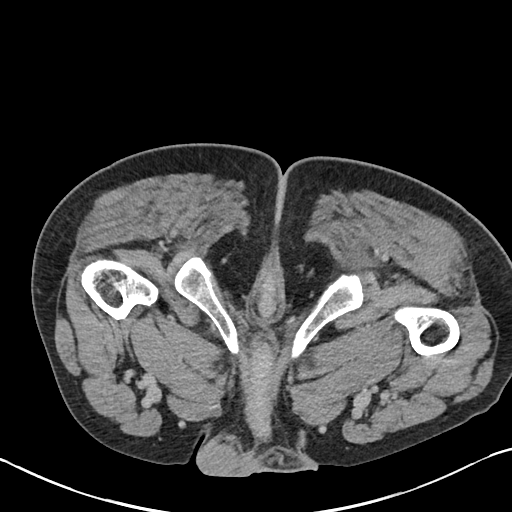
[im 13/98  bone]
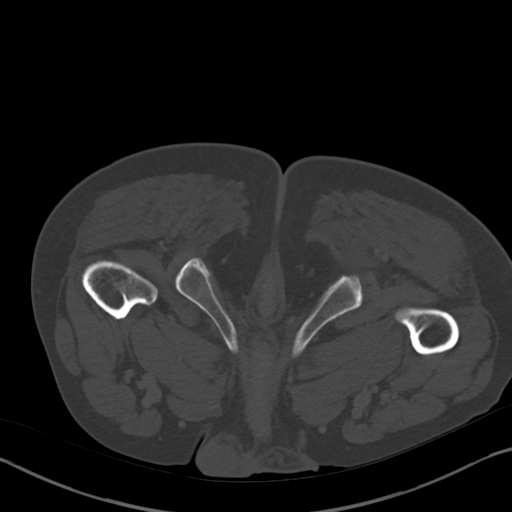
[im 25/98  soft-tissue]
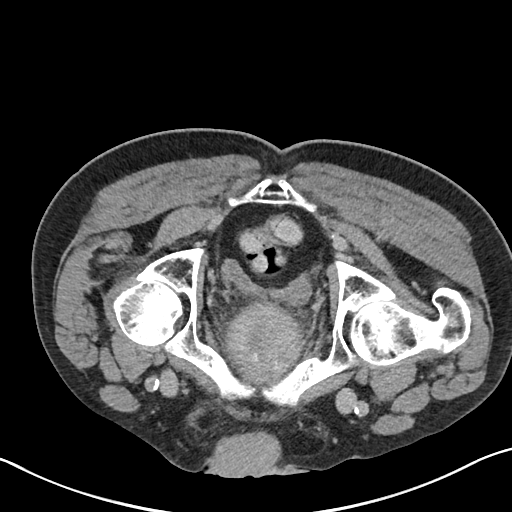
[im 37/98  soft-tissue]
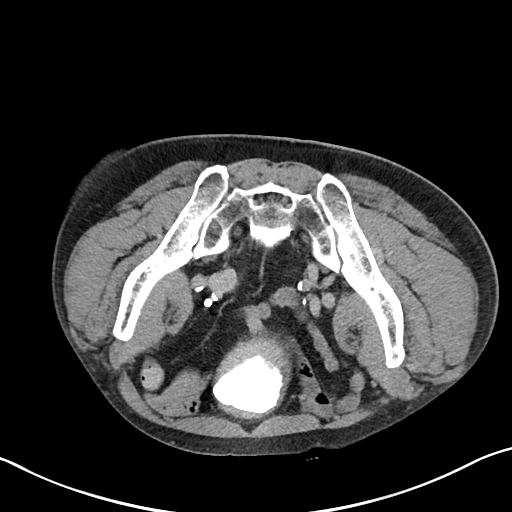
[im 49/98  soft-tissue]
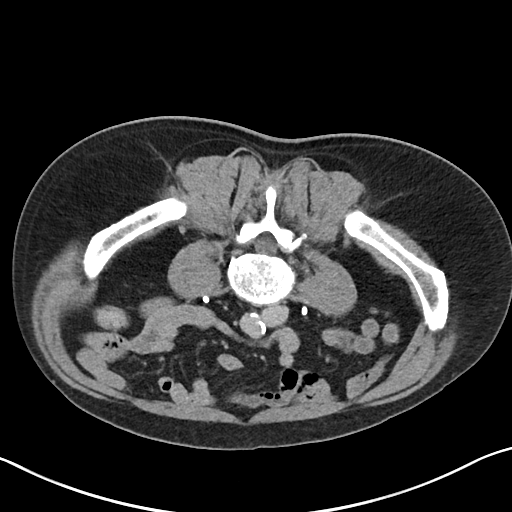
[im 49/98  lung]
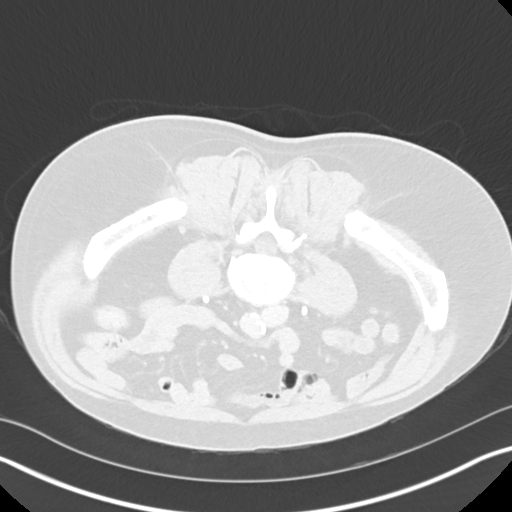
[im 61/98  soft-tissue]
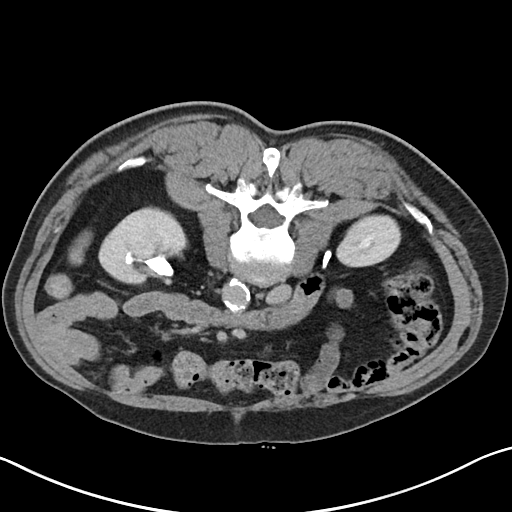
[im 61/98  lung]
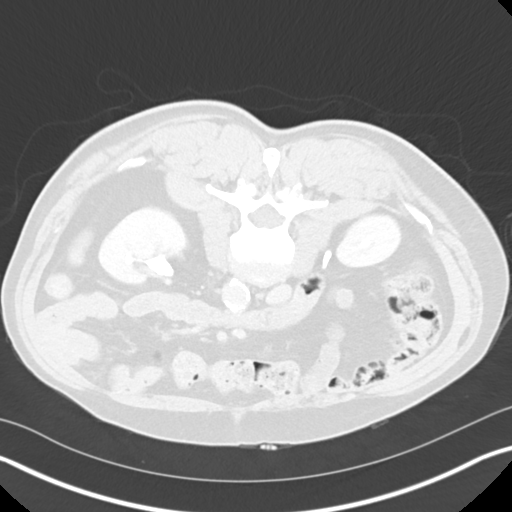
[im 73/98  soft-tissue]
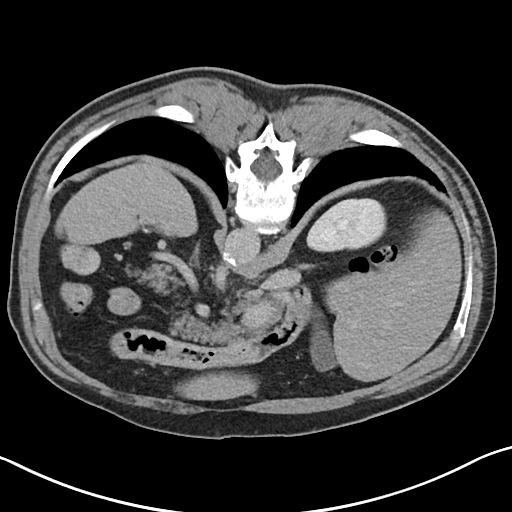
[im 73/98  lung]
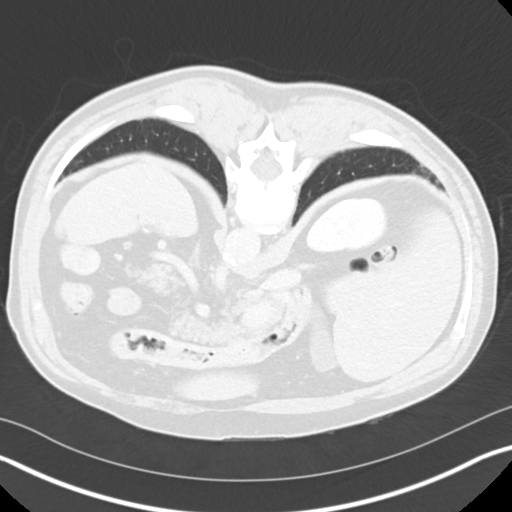
[im 85/98  soft-tissue]
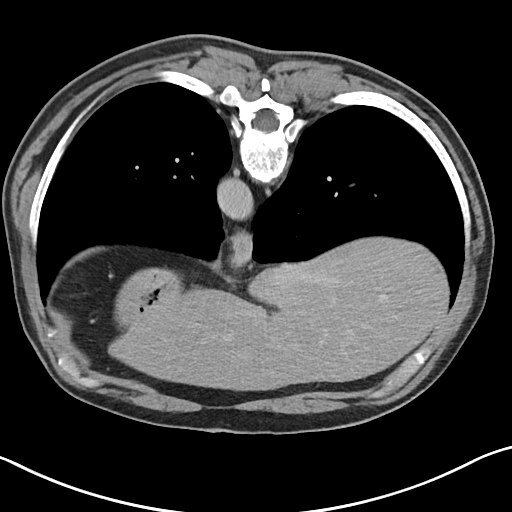
[im 85/98  lung]
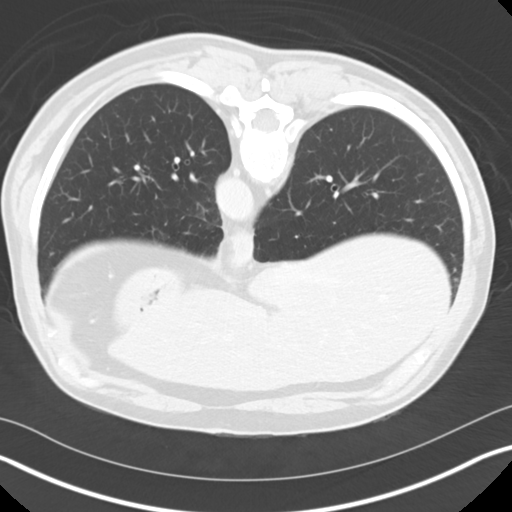

[Series 20: cor delay delay prone · coronal · delayed · 0.72mm/px · 2 of 166 slices shown, 3 images]
[im 56/166  soft-tissue]
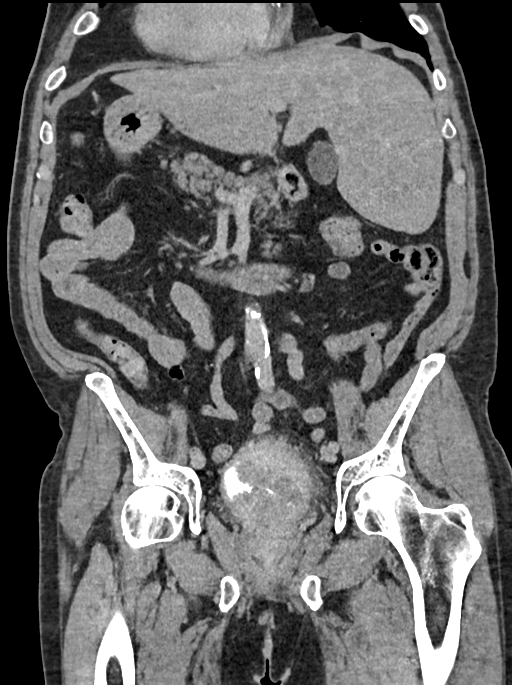
[im 56/166  bone]
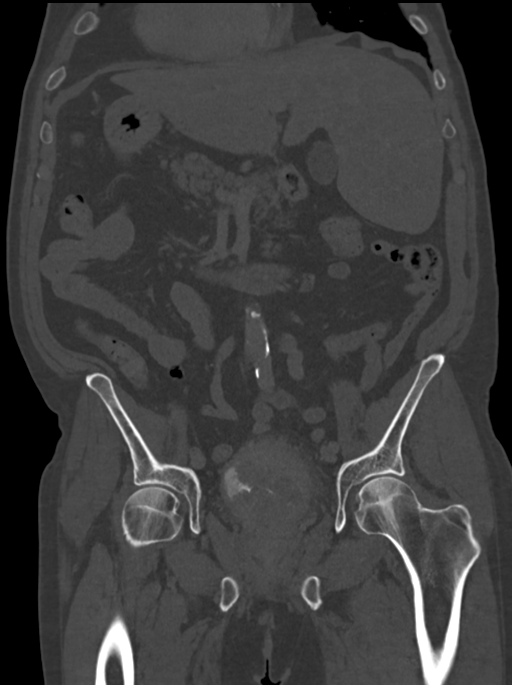
[im 111/166  soft-tissue]
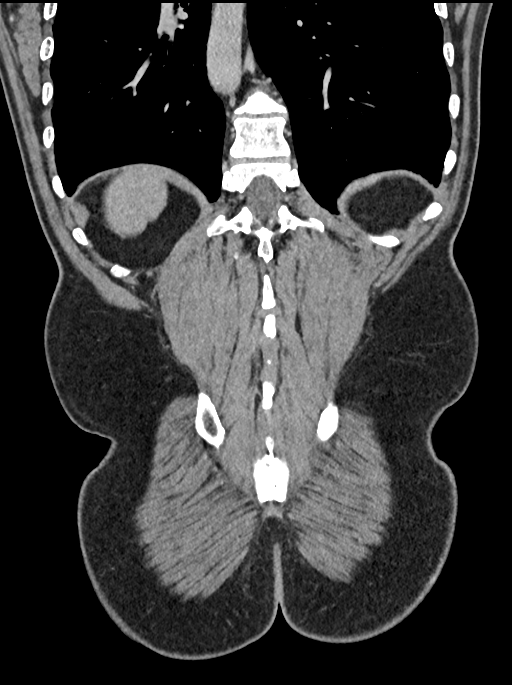

[11 of 46 positions shown; findings below may reference images not displayed]

FINDINGS: Lower chest: Clear lung bases. No significant pleural or pericardial
effusion. Mild aortic and coronary artery atherosclerosis.

Hepatobiliary: There is a tiny calcified hepatic granuloma. No
suspicious lesion or abnormal enhancement. No evidence of
gallstones, gallbladder wall thickening or biliary dilatation.

Pancreas: Unremarkable. No pancreatic ductal dilatation or
surrounding inflammatory changes.

Spleen: Normal in size without focal abnormality.

Adrenals/Urinary Tract: Both adrenal glands appear normal.
Pre-contrast images demonstrate no renal, ureteral or bladder
calculi. Post-contrast, both kidneys enhance normally. There is no
evidence of enhancing renal mass. Delayed images result in segmental
visualization of the ureters. No focal upper tract urothelial
abnormalities are identified. There is a large, heterogeneously
enhancing bladder mass which is asymmetric to the right. This mass
is partially calcified and measures up to 6.4 x 4.5 cm on image
69/17. The mass enhances from 43 HU on the precontrast images to 109
HU on the immediate postcontrast images, and is highly suspicious
for bladder cancer.

Stomach/Bowel: No evidence of bowel wall thickening, distention or
surrounding inflammatory change. Mild diverticular changes in the
distal colon.

Vascular/Lymphatic: There are no enlarged abdominal or pelvic lymph
nodes. Aortic and branch vessel atherosclerosis. No acute vascular
findings.

Reproductive: The prostate gland does not appear significantly
enlarged, and although incompletely separable from the bladder
lesion, does not appear to be the origin of the bladder mass.

Other: No evidence of abdominal wall mass or hernia. No ascites.

Musculoskeletal: No acute osseous findings or evidence of metastatic
disease. There are bilateral L5 pars defects with a grade 1
anterolisthesis and mild biforaminal narrowing at L5-S1.
IMPRESSION: 1. Large heterogeneously enhancing urinary bladder mass, highly
suspicious for bladder cancer. No evidence of metastatic disease.
2. No upper tract urothelial lesion or current ureteral obstruction
identified. No evidence of urinary tract calculus.
3. Bilateral L5 pars defects with grade 1 anterolisthesis and mild
biforaminal narrowing at L5-S1.
4. These results will be called to the ordering clinician or
representative by the Radiologist Assistant, and communication
documented in the PACS or zVision Dashboard.

## 2019-09-28 ENCOUNTER — Telehealth: Payer: Self-pay | Admitting: Urology

## 2019-09-28 NOTE — Telephone Encounter (Signed)
Urine cytology showed no abnormal cells.  Follow-up as scheduled 

## 2019-09-29 NOTE — Telephone Encounter (Signed)
Notified patient as instructed. Left message on cell phone

## 2019-10-08 ENCOUNTER — Telehealth: Payer: Self-pay

## 2019-10-08 NOTE — Telephone Encounter (Signed)
Left patient a message to call, apt made

## 2019-10-08 NOTE — Telephone Encounter (Signed)
-----   Message from Billey Co, MD sent at 09/18/2019  1:52 PM EDT ----- Regarding: bcg Schedule BCG x 3 weeks starting July 2021. Thanks  Nickolas Madrid, MD 09/18/2019

## 2019-10-10 NOTE — Telephone Encounter (Signed)
Left pt message to call °

## 2019-11-18 NOTE — Telephone Encounter (Signed)
Attempted to reach patient again, vmail left and letter printed to patient

## 2019-12-04 NOTE — Telephone Encounter (Signed)
Patient called back confirming apt

## 2019-12-19 ENCOUNTER — Encounter: Payer: Self-pay | Admitting: Urology

## 2019-12-19 ENCOUNTER — Ambulatory Visit (INDEPENDENT_AMBULATORY_CARE_PROVIDER_SITE_OTHER): Payer: Medicare Other | Admitting: Urology

## 2019-12-19 ENCOUNTER — Other Ambulatory Visit: Payer: Self-pay

## 2019-12-19 VITALS — BP 146/82 | HR 80 | Ht 67.0 in | Wt 162.0 lb

## 2019-12-19 DIAGNOSIS — Z8551 Personal history of malignant neoplasm of bladder: Secondary | ICD-10-CM | POA: Diagnosis not present

## 2019-12-19 NOTE — Progress Notes (Signed)
   12/19/19  CC:  Chief Complaint  Patient presents with  . 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 Jared Nolan has no complaints today.  Denies dysuria or gross hematuria  Blood pressure (!) 146/82, pulse 80, height 5\' 7"  (1.702 m), weight 162 lb (73.5 kg). NED. A&Ox3.   No respiratory distress   Abd soft, NT, ND Normal phallus with bilateral descended testicles  Cystoscopy Procedure Note  Patient identification was confirmed, informed consent was obtained, and patient was prepped using Betadine solution.  Lidocaine jelly was administered per urethral meatus.     Pre-Procedure: - Inspection reveals a normal caliber ureteral meatus.  Procedure: The flexible cystoscope was introduced without difficulty - No urethral strictures/lesions are present. - Moderate lateral lobe enlargement prostate  - Mild elevation bladder neck - Bilateral ureteral orifices identified - Bladder mucosa reveals no tumor; mild erythema posterior wall consistent with prior BCG - No bladder stones -Mild trabeculation  Retroflexion shows no tumor   Post-Procedure: - Patient tolerated the procedure well  Assessment/ Plan: -No evidence recurrent tumor -Maintenance BCG next month -Surveillance cystoscopy 3  months -Move to semiannual surveillance if next cystoscopy shows no recurrent tumor   Abbie Sons, MD

## 2019-12-22 ENCOUNTER — Encounter: Payer: Self-pay | Admitting: Urology

## 2019-12-22 LAB — URINALYSIS, COMPLETE
Bilirubin, UA: NEGATIVE
Glucose, UA: NEGATIVE
Ketones, UA: NEGATIVE
Leukocytes,UA: NEGATIVE
Nitrite, UA: NEGATIVE
Protein,UA: NEGATIVE
RBC, UA: NEGATIVE
Specific Gravity, UA: 1.015 (ref 1.005–1.030)
Urobilinogen, Ur: 0.2 mg/dL (ref 0.2–1.0)
pH, UA: 7 (ref 5.0–7.5)

## 2019-12-22 LAB — MICROSCOPIC EXAMINATION: Bacteria, UA: NONE SEEN

## 2020-01-09 ENCOUNTER — Ambulatory Visit: Payer: Self-pay | Admitting: Physician Assistant

## 2020-01-16 ENCOUNTER — Ambulatory Visit: Payer: Self-pay | Admitting: Physician Assistant

## 2020-01-23 ENCOUNTER — Other Ambulatory Visit: Payer: Self-pay

## 2020-01-23 ENCOUNTER — Ambulatory Visit (INDEPENDENT_AMBULATORY_CARE_PROVIDER_SITE_OTHER): Payer: Medicare Other | Admitting: Physician Assistant

## 2020-01-23 DIAGNOSIS — Z8551 Personal history of malignant neoplasm of bladder: Secondary | ICD-10-CM | POA: Diagnosis not present

## 2020-01-23 LAB — URINALYSIS, COMPLETE
Bilirubin, UA: NEGATIVE
Glucose, UA: NEGATIVE
Ketones, UA: NEGATIVE
Leukocytes,UA: NEGATIVE
Nitrite, UA: NEGATIVE
Protein,UA: NEGATIVE
RBC, UA: NEGATIVE
Specific Gravity, UA: 1.02 (ref 1.005–1.030)
Urobilinogen, Ur: 0.2 mg/dL (ref 0.2–1.0)
pH, UA: 5.5 (ref 5.0–7.5)

## 2020-01-23 LAB — MICROSCOPIC EXAMINATION
Bacteria, UA: NONE SEEN
RBC, Urine: NONE SEEN /hpf (ref 0–2)

## 2020-01-23 MED ORDER — BCG LIVE 50 MG IS SUSR
3.2400 mL | Freq: Once | INTRAVESICAL | Status: AC
  Administered 2020-01-23: 81 mg via INTRAVESICAL

## 2020-01-23 NOTE — Progress Notes (Signed)
BCG Bladder Instillation  BCG # 1 of 3  Due to Bladder Cancer patient is present today for a BCG treatment. Patient was cleaned and prepped in a sterile fashion with betadine. A 14FR catheter was inserted, urine return was noted 58ml, urine was yellow in color.  46ml of reconstituted BCG was instilled into the bladder. The catheter was then removed. Patient tolerated well, no complications were noted  Performed by: Debroah Loop, PA-C and Verlene Mayer, CMA  Follow up/ Additional notes: Reviewed post-treatment instructions including holding urine for 2 hours, quarter turns, and bleaching the toilet.

## 2020-01-30 ENCOUNTER — Encounter: Payer: Self-pay | Admitting: Physician Assistant

## 2020-01-30 ENCOUNTER — Other Ambulatory Visit: Payer: Self-pay

## 2020-01-30 ENCOUNTER — Ambulatory Visit (INDEPENDENT_AMBULATORY_CARE_PROVIDER_SITE_OTHER): Payer: Medicare Other | Admitting: Physician Assistant

## 2020-01-30 DIAGNOSIS — R310 Gross hematuria: Secondary | ICD-10-CM | POA: Diagnosis not present

## 2020-01-30 DIAGNOSIS — C672 Malignant neoplasm of lateral wall of bladder: Secondary | ICD-10-CM

## 2020-01-30 LAB — URINALYSIS, COMPLETE
Bilirubin, UA: NEGATIVE
Glucose, UA: NEGATIVE
Ketones, UA: NEGATIVE
Leukocytes,UA: NEGATIVE
Nitrite, UA: NEGATIVE
Protein,UA: NEGATIVE
RBC, UA: NEGATIVE
Specific Gravity, UA: 1.02 (ref 1.005–1.030)
Urobilinogen, Ur: 0.2 mg/dL (ref 0.2–1.0)
pH, UA: 6 (ref 5.0–7.5)

## 2020-01-30 LAB — MICROSCOPIC EXAMINATION: Bacteria, UA: NONE SEEN

## 2020-01-30 MED ORDER — BCG LIVE 50 MG IS SUSR
3.2400 mL | Freq: Once | INTRAVESICAL | Status: AC
  Administered 2020-01-30: 81 mg via INTRAVESICAL

## 2020-01-30 NOTE — Progress Notes (Signed)
BCG Bladder Instillation  BCG # 2  Due to Bladder Cancer patient is present today for a BCG treatment. Patient was cleaned and prepped in a sterile fashion with betadine. A 14FR catheter was inserted, urine return was noted 175ml, urine was yellow in color.  76ml of reconstituted BCG was instilled into the bladder. The catheter was then removed. Patient tolerated well, no complications were noted  Performed by: Verlene Mayer, [place], CMA  Follow up/ Additional: Next week

## 2020-02-06 ENCOUNTER — Ambulatory Visit (INDEPENDENT_AMBULATORY_CARE_PROVIDER_SITE_OTHER): Payer: Medicare Other

## 2020-02-06 ENCOUNTER — Other Ambulatory Visit: Payer: Self-pay

## 2020-02-06 DIAGNOSIS — C672 Malignant neoplasm of lateral wall of bladder: Secondary | ICD-10-CM | POA: Diagnosis not present

## 2020-02-06 LAB — URINALYSIS, COMPLETE
Bilirubin, UA: NEGATIVE
Glucose, UA: NEGATIVE
Leukocytes,UA: NEGATIVE
Nitrite, UA: NEGATIVE
Protein,UA: NEGATIVE
RBC, UA: NEGATIVE
Specific Gravity, UA: 1.02 (ref 1.005–1.030)
Urobilinogen, Ur: 0.2 mg/dL (ref 0.2–1.0)
pH, UA: 5.5 (ref 5.0–7.5)

## 2020-02-06 LAB — MICROSCOPIC EXAMINATION: Bacteria, UA: NONE SEEN

## 2020-02-06 MED ORDER — BCG LIVE 50 MG IS SUSR
3.2400 mL | Freq: Once | INTRAVESICAL | Status: AC
  Administered 2020-02-06: 81 mg via INTRAVESICAL

## 2020-02-06 NOTE — Progress Notes (Signed)
BCG Bladder Instillation  BCG # 3  Due to Bladder Cancer patient is present today for a BCG treatment. Patient was cleaned and prepped in a sterile fashion with betadine. A 14FR catheter was inserted, urine return was noted 46ml, urine was yellow in color. 81ml of reconstituted BCG was instilled into the bladder. The catheter was then removed. Patient tolerated well, no complications were noted.  Preformed by: Gordy Clement, [place], CMA  Follow up/ Additional notes: RTC as scheduled for cysto

## 2020-03-21 NOTE — Progress Notes (Signed)
   03/22/2020  CC:  Chief Complaint  Patient presents with  . Cysto    HPI: Jared Nolan is a 69 y.o. male who presents today for a cystoscopy.   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 NED. A&Ox3.   No respiratory distress   Abd soft, NT, ND Normal phallus with bilateral descended testicles  Cystoscopy Procedure Note  Patient identification was confirmed, informed consent was obtained, and patient was prepped using Betadine solution.  Lidocaine jelly was administered per urethral meatus.     Pre-Procedure: - Inspection reveals a normal caliber urethral meatus.  Procedure: The flexible cystoscope was introduced without difficulty - No urethral strictures/lesions are present. - Moderate lateral lobe enlargement prostate  - Mild elevation bladder neck - Bilateral ureteral orifices identified - Bladder mucosa  reveals no tumors; mild erythema posterior wall consistent with prior BCG - No bladder stones - Mild trabeculation  Retroflexion shows no tumors   Post-Procedure: - Patient tolerated the procedure well  Assessment/ Plan: -No evidence of recurrent tumor -Surveillance  cystoscopy 3 months -Recommend continuing maintenance BCG and he would be due for 3-week course January 2022 however he wanted to discuss further at time of next cystoscopy  I, Selena Batten, am acting as a scribe for Dr. Nicki Reaper C. Cyani Kallstrom,  I have reviewed the above documentation for accuracy and completeness, and I agree with the above.   Abbie Sons, MD

## 2020-03-22 ENCOUNTER — Encounter: Payer: Self-pay | Admitting: Urology

## 2020-03-22 ENCOUNTER — Other Ambulatory Visit: Payer: Self-pay

## 2020-03-22 ENCOUNTER — Ambulatory Visit (INDEPENDENT_AMBULATORY_CARE_PROVIDER_SITE_OTHER): Payer: Medicare Other | Admitting: Urology

## 2020-03-22 VITALS — BP 148/84 | HR 86 | Ht 67.0 in | Wt 160.0 lb

## 2020-03-22 DIAGNOSIS — C679 Malignant neoplasm of bladder, unspecified: Secondary | ICD-10-CM

## 2020-03-22 DIAGNOSIS — C672 Malignant neoplasm of lateral wall of bladder: Secondary | ICD-10-CM | POA: Diagnosis not present

## 2020-03-23 LAB — URINALYSIS, COMPLETE
Bilirubin, UA: NEGATIVE
Leukocytes,UA: NEGATIVE
Nitrite, UA: NEGATIVE
RBC, UA: NEGATIVE
Specific Gravity, UA: 1.015 (ref 1.005–1.030)
Urobilinogen, Ur: 1 mg/dL (ref 0.2–1.0)
pH, UA: 6 (ref 5.0–7.5)

## 2020-03-23 LAB — MICROSCOPIC EXAMINATION: Bacteria, UA: NONE SEEN

## 2020-03-24 DIAGNOSIS — C679 Malignant neoplasm of bladder, unspecified: Secondary | ICD-10-CM | POA: Insufficient documentation

## 2020-06-23 ENCOUNTER — Other Ambulatory Visit: Payer: Self-pay | Admitting: Urology

## 2020-06-23 ENCOUNTER — Ambulatory Visit (INDEPENDENT_AMBULATORY_CARE_PROVIDER_SITE_OTHER): Payer: Medicare Other | Admitting: Urology

## 2020-06-23 ENCOUNTER — Encounter: Payer: Self-pay | Admitting: Urology

## 2020-06-23 ENCOUNTER — Other Ambulatory Visit: Payer: Self-pay

## 2020-06-23 VITALS — BP 126/82 | HR 77 | Ht 67.0 in | Wt 162.0 lb

## 2020-06-23 DIAGNOSIS — C679 Malignant neoplasm of bladder, unspecified: Secondary | ICD-10-CM | POA: Diagnosis not present

## 2020-06-23 LAB — URINALYSIS, COMPLETE
Bilirubin, UA: NEGATIVE
Glucose, UA: NEGATIVE
Ketones, UA: NEGATIVE
Leukocytes,UA: NEGATIVE
Nitrite, UA: NEGATIVE
Protein,UA: NEGATIVE
RBC, UA: NEGATIVE
Specific Gravity, UA: 1.02 (ref 1.005–1.030)
Urobilinogen, Ur: 0.2 mg/dL (ref 0.2–1.0)
pH, UA: 6 (ref 5.0–7.5)

## 2020-06-23 LAB — MICROSCOPIC EXAMINATION: Bacteria, UA: NONE SEEN

## 2020-06-23 NOTE — Progress Notes (Signed)
   06/23/20  CC:  Chief Complaint  Patient presents with  . 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  Denies gross hematuria  Blood pressure 126/82, pulse 77, height 5\' 7"  (1.702 m), weight 162 lb (73.5 kg). NED. A&Ox3.   No respiratory distress   Abd soft, NT, ND Normal phallus with bilateral descended testicles  Cystoscopy Procedure Note  Patient identification was confirmed, informed consent was obtained, and patient was prepped using Betadine solution.  Lidocaine jelly was administered per urethral meatus.     Pre-Procedure: - Inspection reveals a normal caliber urethral meatus.  Procedure: The flexible cystoscope was introduced without difficulty - No urethral strictures/lesions are present. - Moderate lateral lobe enlargement prostate  - Mild elevation bladder neck - Bilateral ureteral orifices identified - Bladder mucosa  reveals no tumors; mild erythema posterior wall consistent with prior BCG - No bladder stones - Mild trabeculation  Retroflexion shows no tumors   Post-Procedure: - Patient tolerated the procedure well  Assessment/ Plan:  No recurrent tumor  Due for maintenance BCG February  2022  Urine cytology sent  2 years recurrence free and schedule follow-up cystoscopy 6 months  Upper tract imaging on follow-up   Abbie Sons, MD

## 2020-06-24 LAB — CYTOLOGY - NON PAP

## 2020-07-08 NOTE — Progress Notes (Unsigned)
BCG Bladder Instillation  BCG # 1/3  Due to Bladder Cancer patient is present today for a BCG treatment. Patient was cleaned and prepped in a sterile fashion with betadine. A 14 FR catheter was inserted, urine return was noted 30 ml, urine was yellow clear in color.  44ml of reconstituted BCG was instilled into the bladder. The catheter was then removed. Patient tolerated well, no complications were noted  Performed by: Michiel Cowboy, PA-C  Follow up/ Additional notes: One week for #2/3 BCG

## 2020-07-09 ENCOUNTER — Other Ambulatory Visit: Payer: Self-pay

## 2020-07-09 ENCOUNTER — Ambulatory Visit (INDEPENDENT_AMBULATORY_CARE_PROVIDER_SITE_OTHER): Payer: Medicare Other | Admitting: Urology

## 2020-07-09 DIAGNOSIS — C679 Malignant neoplasm of bladder, unspecified: Secondary | ICD-10-CM

## 2020-07-09 LAB — MICROSCOPIC EXAMINATION: Bacteria, UA: NONE SEEN

## 2020-07-09 LAB — URINALYSIS, COMPLETE
Bilirubin, UA: NEGATIVE
Glucose, UA: NEGATIVE
Leukocytes,UA: NEGATIVE
Nitrite, UA: NEGATIVE
RBC, UA: NEGATIVE
Specific Gravity, UA: 1.025 (ref 1.005–1.030)
Urobilinogen, Ur: 0.2 mg/dL (ref 0.2–1.0)
pH, UA: 6 (ref 5.0–7.5)

## 2020-07-09 MED ORDER — BCG LIVE 50 MG IS SUSR
3.2400 mL | Freq: Once | INTRAVESICAL | Status: AC
  Administered 2020-07-09: 81 mg via INTRAVESICAL

## 2020-07-09 NOTE — Addendum Note (Signed)
Addended by: Alvera Novel on: 07/09/2020 09:46 AM   Modules accepted: Orders

## 2020-07-15 NOTE — Progress Notes (Unsigned)
BCG Bladder Instillation  BCG # 2/3  Due to Bladder Cancer patient is present today for a BCG treatment. Patient was cleaned and prepped in a sterile fashion with betadine. A 14 FR catheter was inserted, urine return was noted 30 ml, urine was yellow clear in color.  5ml of reconstituted BCG was instilled into the bladder. The catheter was then removed. Patient tolerated well, no complications were noted  Performed by: Zara Council, PA-C  Follow up/ Additional notes: One week for #3/3 BCG

## 2020-07-16 ENCOUNTER — Ambulatory Visit (INDEPENDENT_AMBULATORY_CARE_PROVIDER_SITE_OTHER): Payer: Medicare Other | Admitting: Urology

## 2020-07-16 ENCOUNTER — Encounter: Payer: Self-pay | Admitting: Urology

## 2020-07-16 ENCOUNTER — Other Ambulatory Visit: Payer: Self-pay

## 2020-07-16 DIAGNOSIS — C679 Malignant neoplasm of bladder, unspecified: Secondary | ICD-10-CM

## 2020-07-16 LAB — MICROSCOPIC EXAMINATION: Bacteria, UA: NONE SEEN

## 2020-07-16 LAB — URINALYSIS, COMPLETE
Bilirubin, UA: NEGATIVE
Glucose, UA: NEGATIVE
Ketones, UA: NEGATIVE
Leukocytes,UA: NEGATIVE
Nitrite, UA: NEGATIVE
Protein,UA: NEGATIVE
RBC, UA: NEGATIVE
Specific Gravity, UA: 1.02 (ref 1.005–1.030)
Urobilinogen, Ur: 0.2 mg/dL (ref 0.2–1.0)
pH, UA: 6.5 (ref 5.0–7.5)

## 2020-07-16 MED ORDER — BCG LIVE 50 MG IS SUSR
3.2400 mL | Freq: Once | INTRAVESICAL | Status: AC
  Administered 2020-07-16: 81 mg via INTRAVESICAL

## 2020-07-20 NOTE — Progress Notes (Deleted)
BCG Bladder Instillation  BCG # 3/3  Due to Bladder Cancer patient is present today for a BCG treatment. Patient was cleaned and prepped in a sterile fashion with betadine. A ***FR catheter was inserted, urine return was noted ***ml, urine was *** in color.  12ml of reconstituted BCG was instilled into the bladder. The catheter was then removed. Patient tolerated well, {dnt [id number]}  Preformed by: ***  Follow up/ Additional notes: Follow-up for surveillance cystoscopy with Dr. Bernardo Heater on September 24, 2020

## 2020-07-21 ENCOUNTER — Ambulatory Visit: Payer: Self-pay | Admitting: Urology

## 2020-07-21 DIAGNOSIS — C679 Malignant neoplasm of bladder, unspecified: Secondary | ICD-10-CM

## 2020-07-26 NOTE — Progress Notes (Signed)
BCG Bladder Instillation  BCG # 3/3  Due to Bladder Cancer patient is present today for a BCG treatment. Patient was cleaned and prepped in a sterile fashion with betadine. A 14 FR catheter was inserted, urine return was noted 50 ml, urine was yellow clear in color.  22ml of reconstituted BCG was instilled into the bladder. The catheter was then removed. Patient tolerated well, no complications were noted  Performed by: Zara Council, PA-C and Kyra Manges, CMA  Follow up/ Additional notes: Follow-up for surveillance cystoscopy with Dr. Bernardo Heater on September 24, 2020

## 2020-07-27 ENCOUNTER — Other Ambulatory Visit: Payer: Self-pay

## 2020-07-27 ENCOUNTER — Ambulatory Visit (INDEPENDENT_AMBULATORY_CARE_PROVIDER_SITE_OTHER): Payer: Medicare Other | Admitting: Urology

## 2020-07-27 DIAGNOSIS — C679 Malignant neoplasm of bladder, unspecified: Secondary | ICD-10-CM | POA: Diagnosis not present

## 2020-07-27 MED ORDER — BCG LIVE 50 MG IS SUSR
3.2400 mL | Freq: Once | INTRAVESICAL | Status: AC
  Administered 2020-07-27: 81 mg via INTRAVESICAL

## 2020-07-28 LAB — URINALYSIS, COMPLETE
Bilirubin, UA: NEGATIVE
Glucose, UA: NEGATIVE
Ketones, UA: NEGATIVE
Leukocytes,UA: NEGATIVE
Nitrite, UA: NEGATIVE
Protein,UA: NEGATIVE
Specific Gravity, UA: 1.015 (ref 1.005–1.030)
Urobilinogen, Ur: 0.2 mg/dL (ref 0.2–1.0)
pH, UA: 7 (ref 5.0–7.5)

## 2020-07-28 LAB — MICROSCOPIC EXAMINATION
Bacteria, UA: NONE SEEN
Epithelial Cells (non renal): NONE SEEN /hpf (ref 0–10)

## 2020-09-24 ENCOUNTER — Ambulatory Visit (INDEPENDENT_AMBULATORY_CARE_PROVIDER_SITE_OTHER): Payer: Medicare Other | Admitting: Urology

## 2020-09-24 ENCOUNTER — Encounter: Payer: Self-pay | Admitting: Urology

## 2020-09-24 ENCOUNTER — Other Ambulatory Visit: Payer: Self-pay

## 2020-09-24 VITALS — BP 126/80 | HR 77 | Ht 67.0 in | Wt 164.0 lb

## 2020-09-24 DIAGNOSIS — Z8551 Personal history of malignant neoplasm of bladder: Secondary | ICD-10-CM | POA: Diagnosis not present

## 2020-09-24 DIAGNOSIS — R310 Gross hematuria: Secondary | ICD-10-CM

## 2020-09-24 LAB — URINALYSIS, COMPLETE
Bilirubin, UA: NEGATIVE
Glucose, UA: NEGATIVE
Ketones, UA: NEGATIVE
Leukocytes,UA: NEGATIVE
Nitrite, UA: NEGATIVE
Protein,UA: NEGATIVE
RBC, UA: NEGATIVE
Specific Gravity, UA: 1.02 (ref 1.005–1.030)
Urobilinogen, Ur: 0.2 mg/dL (ref 0.2–1.0)
pH, UA: 6 (ref 5.0–7.5)

## 2020-09-24 LAB — MICROSCOPIC EXAMINATION
Bacteria, UA: NONE SEEN
Epithelial Cells (non renal): NONE SEEN /hpf (ref 0–10)
RBC, Urine: NONE SEEN /hpf (ref 0–2)
WBC, UA: NONE SEEN /hpf (ref 0–5)

## 2020-09-26 ENCOUNTER — Encounter: Payer: Self-pay | Admitting: Urology

## 2020-09-26 NOTE — Progress Notes (Signed)
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 NED. A&Ox3.   No respiratory distress   Abd soft, NT, ND Normal phallus with bilateral descended testicles  Cystoscopy Procedure Note  Patient identification was confirmed, informed consent was obtained, and patient was prepped using Betadine solution.  Lidocaine jelly was administered per urethral meatus.     Pre-Procedure: - Inspection reveals a normal caliber urethral meatus.  Procedure: The flexible cystoscope was introduced without difficulty - No urethral strictures/lesions are present. - Moderate lateral lobe enlargement prostate  - Mild elevation bladder neck - Bilateral ureteral orifices identified - Bladder mucosa  reveals no ulcers, tumors, or lesions - No bladder stones - Moderate trabeculation  Retroflexion shows no abnormalities   Post-Procedure: - Patient tolerated the procedure well  Assessment/ Plan:  No evidence recurrent tumor  Has completed maintenance BCG  Follow-up cystoscopy 6 months  Return in about 6 months (around 03/27/2021)  for cysto .   Abbie Sons, MD

## 2021-03-30 ENCOUNTER — Encounter: Payer: Self-pay | Admitting: Urology

## 2021-03-30 ENCOUNTER — Ambulatory Visit (INDEPENDENT_AMBULATORY_CARE_PROVIDER_SITE_OTHER): Payer: Medicare Other | Admitting: Urology

## 2021-03-30 ENCOUNTER — Other Ambulatory Visit: Payer: Self-pay

## 2021-03-30 VITALS — BP 134/77 | HR 60 | Ht 67.0 in | Wt 164.0 lb

## 2021-03-30 DIAGNOSIS — R310 Gross hematuria: Secondary | ICD-10-CM

## 2021-03-30 DIAGNOSIS — C672 Malignant neoplasm of lateral wall of bladder: Secondary | ICD-10-CM

## 2021-03-30 DIAGNOSIS — C679 Malignant neoplasm of bladder, unspecified: Secondary | ICD-10-CM | POA: Diagnosis not present

## 2021-03-30 NOTE — Progress Notes (Signed)
   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  He has no complaints.  No gross hematuria  See rooming tab for vital signs NED. A&Ox3.   No respiratory distress   Abd soft, NT, ND Normal phallus with bilateral descended testicles  Cystoscopy Procedure Note  Patient identification was confirmed, informed consent was obtained, and patient was prepped using Betadine solution.  Lidocaine jelly was administered per urethral meatus.     Pre-Procedure: - Inspection reveals a normal caliber urethral meatus.   Procedure: The flexible cystoscope was introduced without difficulty - No urethral strictures/lesions are present. - Moderate lateral lobe enlargement prostate  - Mild elevation bladder neck - Bilateral ureteral orifices identified - Bladder mucosa  reveals no ulcers, tumors, or lesions - No bladder stones - Moderate trabeculation   Retroflexion shows no abnormalities   Post-Procedure: - Patient tolerated the procedure well  Assessment/ Plan: No evidence recurrent tumor Urine cytology sent Follow-up 6 months for continued surveillance    Jared Sons, MD

## 2021-03-31 LAB — URINALYSIS, COMPLETE
Bilirubin, UA: NEGATIVE
Glucose, UA: NEGATIVE
Ketones, UA: NEGATIVE
Leukocytes,UA: NEGATIVE
Nitrite, UA: NEGATIVE
Protein,UA: NEGATIVE
RBC, UA: NEGATIVE
Specific Gravity, UA: 1.015 (ref 1.005–1.030)
Urobilinogen, Ur: 0.2 mg/dL (ref 0.2–1.0)
pH, UA: 6.5 (ref 5.0–7.5)

## 2021-03-31 LAB — MICROSCOPIC EXAMINATION
Bacteria, UA: NONE SEEN
RBC, Urine: NONE SEEN /hpf (ref 0–2)

## 2021-04-01 LAB — CYTOLOGY - NON PAP

## 2021-04-04 ENCOUNTER — Telehealth: Payer: Self-pay | Admitting: *Deleted

## 2021-04-04 NOTE — Telephone Encounter (Signed)
-----   Message from Abbie Sons, MD sent at 04/03/2021  1:11 PM EDT ----- Urine cytology showed no cells suspicious for recurrent bladder cancer

## 2021-04-04 NOTE — Telephone Encounter (Signed)
Left message on phone per [facility]

## 2021-09-29 ENCOUNTER — Encounter: Payer: Self-pay | Admitting: Urology

## 2021-09-29 ENCOUNTER — Ambulatory Visit (INDEPENDENT_AMBULATORY_CARE_PROVIDER_SITE_OTHER): Payer: Medicare Other | Admitting: Urology

## 2021-09-29 VITALS — BP 161/83 | HR 80 | Ht 67.0 in | Wt 160.0 lb

## 2021-09-29 DIAGNOSIS — C679 Malignant neoplasm of bladder, unspecified: Secondary | ICD-10-CM | POA: Diagnosis not present

## 2021-09-29 LAB — URINALYSIS, COMPLETE
Bilirubin, UA: NEGATIVE
Glucose, UA: NEGATIVE
Ketones, UA: NEGATIVE
Leukocytes,UA: NEGATIVE
Nitrite, UA: NEGATIVE
Protein,UA: NEGATIVE
RBC, UA: NEGATIVE
Specific Gravity, UA: 1.015 (ref 1.005–1.030)
Urobilinogen, Ur: 0.2 mg/dL (ref 0.2–1.0)
pH, UA: 6.5 (ref 5.0–7.5)

## 2021-09-29 LAB — MICROSCOPIC EXAMINATION: Bacteria, UA: NONE SEEN

## 2021-09-29 NOTE — Progress Notes (Signed)
? ?  09/29/21 ? ?CC:  ?Chief Complaint  ?Patient presents with  ? Cysto  ? ? 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 ?HPI: Mr. Neumeister presents for surveillance cystoscopy.  He has no complaints.  No gross hematuria ? ?See rooming tab for vital signs ?NED. A&Ox3.   ?No respiratory distress   ?Abd soft, NT, ND ?Normal phallus with bilateral descended testicles ? ?Cystoscopy Procedure Note ? ?Patient identification was confirmed, informed consent was obtained, and patient was prepped using Betadine solution.  Lidocaine jelly was administered per urethral meatus.   ? ? ?Pre-Procedure: ?- Inspection reveals a normal caliber urethral meatus. ?  ?Procedure: ?The flexible cystoscope was introduced without difficulty ?- No urethral strictures/lesions are present. ?- Moderate lateral lobe enlargement prostate  ?- Mild elevation bladder neck ?- Bilateral ureteral orifices identified ?- Bladder mucosa  reveals no ulcers, tumors, or lesions ?- No bladder stones ?- Moderate trabeculation ?  ?Retroflexion shows no abnormalities ? ? ?Post-Procedure: ?- Patient tolerated the procedure well ? ?Assessment/ Plan: ?No evidence recurrent tumor ?Urine cytology sent ?Follow-up 6 months for continued surveillance ? ? ? ?Abbie Sons, MD ? ?

## 2022-04-03 ENCOUNTER — Encounter: Payer: Self-pay | Admitting: Urology

## 2022-04-03 ENCOUNTER — Ambulatory Visit (INDEPENDENT_AMBULATORY_CARE_PROVIDER_SITE_OTHER): Payer: Medicare Other | Admitting: Urology

## 2022-04-03 VITALS — BP 129/80 | HR 75 | Ht 70.0 in | Wt 165.0 lb

## 2022-04-03 DIAGNOSIS — R310 Gross hematuria: Secondary | ICD-10-CM

## 2022-04-03 DIAGNOSIS — Z8551 Personal history of malignant neoplasm of bladder: Secondary | ICD-10-CM

## 2022-04-03 DIAGNOSIS — C679 Malignant neoplasm of bladder, unspecified: Secondary | ICD-10-CM

## 2022-04-03 NOTE — Addendum Note (Signed)
Addended by: Chrystie Nose on: 04/03/2022 01:34 PM   Modules accepted: Orders

## 2022-04-03 NOTE — Progress Notes (Signed)
   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  He has no complaints.  No gross hematuria  See rooming tab for vital signs   Cystoscopy Procedure Note  Patient identification was confirmed, informed consent was obtained, and patient was prepped using Betadine solution.  Lidocaine jelly was administered per urethral meatus.     Pre-Procedure: - Inspection reveals a normal caliber urethral meatus.   Procedure: The flexible cystoscope was introduced without difficulty - No urethral strictures/lesions are present. - Moderate lateral lobe enlargement prostate  - Mild elevation bladder neck - Bilateral ureteral orifices identified - Bladder mucosa  reveals no ulcers, tumors, or lesions - No bladder stones - Moderate trabeculation   Retroflexion shows no abnormalities   Post-Procedure: - Patient tolerated the procedure well  Assessment/ Plan: No evidence recurrent tumor Urine cytology sent Follow-up 6 months for continued surveillance CTU 1 year for upper tract monitoring    Abbie Sons, MD

## 2022-04-04 LAB — URINALYSIS, COMPLETE
Bilirubin, UA: NEGATIVE
Glucose, UA: NEGATIVE
Ketones, UA: NEGATIVE
Leukocytes,UA: NEGATIVE
Nitrite, UA: NEGATIVE
Protein,UA: NEGATIVE
RBC, UA: NEGATIVE
Specific Gravity, UA: 1.01 (ref 1.005–1.030)
Urobilinogen, Ur: 0.2 mg/dL (ref 0.2–1.0)
pH, UA: 6.5 (ref 5.0–7.5)

## 2022-04-04 LAB — MICROSCOPIC EXAMINATION: Bacteria, UA: NONE SEEN

## 2022-04-05 LAB — CYTOLOGY - NON PAP

## 2022-04-06 ENCOUNTER — Telehealth: Payer: Self-pay | Admitting: *Deleted

## 2022-04-06 NOTE — Telephone Encounter (Signed)
Notified patient as instructed, patient pleased. Discussed follow-up appointments, patient agrees  

## 2022-04-06 NOTE — Telephone Encounter (Signed)
-----   Message from Abbie Sons, MD sent at 04/06/2022  9:23 AM EDT ----- Urine cytology showed no abnormal cells.  Follow-up as scheduled

## 2022-07-29 ENCOUNTER — Ambulatory Visit
Admission: EM | Admit: 2022-07-29 | Discharge: 2022-07-29 | Disposition: A | Discharge status: Home / Self Care | Payer: Medicare Other | Attending: Emergency Medicine | Admitting: Emergency Medicine

## 2022-07-29 DIAGNOSIS — S60512A Abrasion of left hand, initial encounter: Secondary | ICD-10-CM | POA: Diagnosis not present

## 2022-07-29 DIAGNOSIS — L03114 Cellulitis of left upper limb: Secondary | ICD-10-CM

## 2022-07-29 DIAGNOSIS — W548XXA Other contact with dog, initial encounter: Secondary | ICD-10-CM | POA: Diagnosis not present

## 2022-07-29 MED ORDER — AMOXICILLIN-POT CLAVULANATE 875-125 MG PO TABS: 1.0000 | ORAL_TABLET | Freq: Two times a day (BID) | ORAL | 0 refills | Status: DC

## 2022-07-29 NOTE — Discharge Instructions (Addendum)
Take the Augmentin as directed.  Follow up with your primary care provider if your symptoms are not improving.

## 2022-07-29 NOTE — ED Triage Notes (Signed)
Patient to Urgent Care with complaints of abrasion present to left hand. Reports he was scratched by a dog over a week ago.   Presents today with concerns for possible infection. Redness around wound, reports when he removed his band-aid today there was some purulent discharge. Patient cleaned area with hydrogen peroxide and applied neosporin.
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
## 2023-04-04 ENCOUNTER — Ambulatory Visit (INDEPENDENT_AMBULATORY_CARE_PROVIDER_SITE_OTHER): Payer: Medicare Other | Admitting: Urology

## 2023-04-04 ENCOUNTER — Encounter: Payer: Self-pay | Admitting: Urology

## 2023-04-04 VITALS — BP 152/90 | HR 80 | Ht 67.0 in | Wt 164.0 lb

## 2023-04-04 DIAGNOSIS — C679 Malignant neoplasm of bladder, unspecified: Secondary | ICD-10-CM

## 2023-04-04 DIAGNOSIS — Z8551 Personal history of malignant neoplasm of bladder: Secondary | ICD-10-CM | POA: Diagnosis not present

## 2023-04-04 NOTE — Progress Notes (Signed)
   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  He has no complaints.  No gross hematuria  See rooming tab for vital signs   Cystoscopy Procedure Note  Patient identification was confirmed, informed consent was obtained, and patient was prepped using Betadine solution.  Lidocaine jelly was administered per urethral meatus.     Pre-Procedure: - Inspection reveals a normal caliber urethral meatus.   Procedure: The flexible cystoscope was introduced without difficulty - No urethral strictures/lesions are present. - Moderate lateral lobe enlargement prostate  - Mild elevation bladder neck - Bilateral ureteral orifices identified - Bladder mucosa  reveals no ulcers, tumors, or lesions - No bladder stones - Moderate trabeculation   Retroflexion shows no abnormalities   Post-Procedure: - Patient tolerated the procedure well  Assessment/ Plan: No evidence recurrent tumor Urine cytology sent Follow-up 6 months for continued surveillance CTU recc for upper tract monitoring. He refused to schedule the appt and stated he was willing to accept the risk of undiagnosed upper tract urothelial carcinoma Agrees to annual  surveillance cysto     Riki Altes, MD

## 2023-04-05 LAB — URINALYSIS, COMPLETE
Bilirubin, UA: NEGATIVE
Glucose, UA: NEGATIVE
Ketones, UA: NEGATIVE
Nitrite, UA: NEGATIVE
Protein,UA: NEGATIVE
RBC, UA: NEGATIVE
Specific Gravity, UA: 1.015 (ref 1.005–1.030)
Urobilinogen, Ur: 0.2 mg/dL (ref 0.2–1.0)
pH, UA: 6.5 (ref 5.0–7.5)

## 2023-04-05 LAB — MICROSCOPIC EXAMINATION

## 2024-01-25 ENCOUNTER — Encounter: Payer: Self-pay | Admitting: Urology

## 2024-04-02 ENCOUNTER — Ambulatory Visit (INDEPENDENT_AMBULATORY_CARE_PROVIDER_SITE_OTHER): Admitting: Urology

## 2024-04-02 ENCOUNTER — Encounter: Payer: Self-pay | Admitting: Urology

## 2024-04-02 VITALS — BP 139/80 | HR 84 | Ht 66.0 in | Wt 164.0 lb

## 2024-04-02 DIAGNOSIS — Z8551 Personal history of malignant neoplasm of bladder: Secondary | ICD-10-CM

## 2024-04-02 LAB — URINALYSIS, COMPLETE
Bilirubin, UA: NEGATIVE
Glucose, UA: NEGATIVE
Ketones, UA: NEGATIVE
Leukocytes,UA: NEGATIVE
Nitrite, UA: NEGATIVE
Protein,UA: NEGATIVE
RBC, UA: NEGATIVE
Specific Gravity, UA: 1.015 (ref 1.005–1.030)
Urobilinogen, Ur: 0.2 mg/dL (ref 0.2–1.0)
pH, UA: 6 (ref 5.0–7.5)

## 2024-04-02 LAB — MICROSCOPIC EXAMINATION

## 2024-04-02 NOTE — Progress Notes (Signed)
   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 He refused to schedule and stated he was willing to accept the risk of undiagnosed upper tract urothelial carcinoma    HPI: Jared Nolan presents for surveillance cystoscopy.  He has no complaints.  No gross hematuria  See rooming tab for vital signs   Cystoscopy Procedure Note  Patient identification was confirmed, informed consent was obtained, and patient was prepped using Betadine solution.  Lidocaine  jelly was administered per urethral meatus.     Pre-Procedure: - Inspection reveals a normal caliber urethral meatus.   Procedure: The flexible cystoscope was introduced without difficulty - No urethral strictures/lesions are present. - Moderate lateral lobe enlargement prostate  - Mild elevation bladder neck - Bilateral ureteral orifices identified - Bladder mucosa  reveals no ulcers, tumors, or lesions - No bladder stones - Moderate trabeculation   Retroflexion shows no abnormalities   Post-Procedure: - Patient tolerated the procedure well  Assessment/ Plan: No evidence recurrent tumor Continue annual surveillance cysto     Glendia JAYSON Barba, MD

## 2024-04-03 ENCOUNTER — Ambulatory Visit: Payer: Self-pay | Admitting: Urology

## 2025-04-03 ENCOUNTER — Other Ambulatory Visit: Admitting: Urology
